# Patient Record
Sex: Male | Born: 1970 | Race: White | Hispanic: No | Marital: Single | Smoking: Former smoker
Health system: Southern US, Community
[De-identification: ages and names within clinical notes are randomized; demographics above are authoritative.]

## PROBLEM LIST (undated history)

## (undated) DIAGNOSIS — K5792 Diverticulitis of intestine, part unspecified, without perforation or abscess without bleeding: Secondary | ICD-10-CM

## (undated) DIAGNOSIS — T7840XA Allergy, unspecified, initial encounter: Secondary | ICD-10-CM

## (undated) HISTORY — PX: SMALL INTESTINE SURGERY: SHX150

## (undated) HISTORY — PX: ABDOMINAL SURGERY: SHX537

## (undated) HISTORY — DX: Allergy, unspecified, initial encounter: T78.40XA

## (undated) HISTORY — DX: Diverticulitis of intestine, part unspecified, without perforation or abscess without bleeding: K57.92

---

## 2008-04-05 ENCOUNTER — Ambulatory Visit: Payer: Self-pay | Admitting: Surgery

## 2008-04-11 ENCOUNTER — Inpatient Hospital Stay: Payer: Self-pay | Admitting: Surgery

## 2008-04-22 ENCOUNTER — Inpatient Hospital Stay: Payer: Self-pay | Admitting: Surgery

## 2008-05-03 ENCOUNTER — Ambulatory Visit: Payer: Self-pay | Admitting: Surgery

## 2008-05-11 ENCOUNTER — Ambulatory Visit: Payer: Self-pay | Admitting: Surgery

## 2008-06-22 IMAGING — CT CT GUIDED ABCESS DRAINAGE WITH CATHETER
2 of 4 series · 14 of 32 positions shown, 19 images · non-contrast
Comparison: none

REASON FOR EXAM: Pelvic abscess
COMMENTS:

[Series 2: soft tissue · axial · 0.79mm/px · z∈[-1258,-1090]mm · 8 of 29 slices shown, 13 images (1 of 2)]
[im 4/29  soft-tissue]
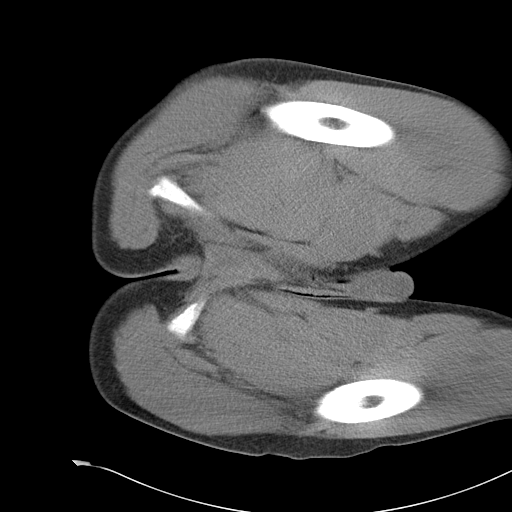
[im 4/29  bone]
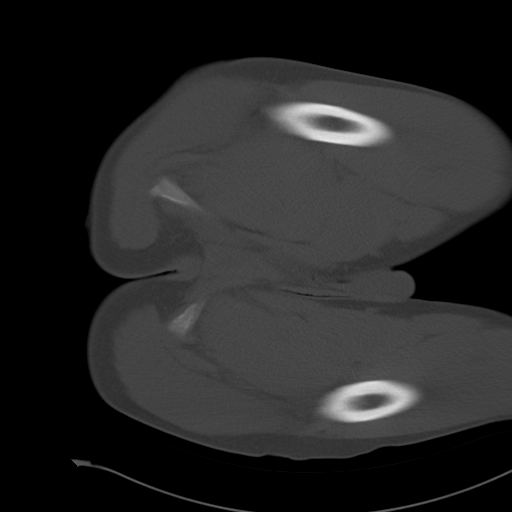
[im 7/29  soft-tissue]
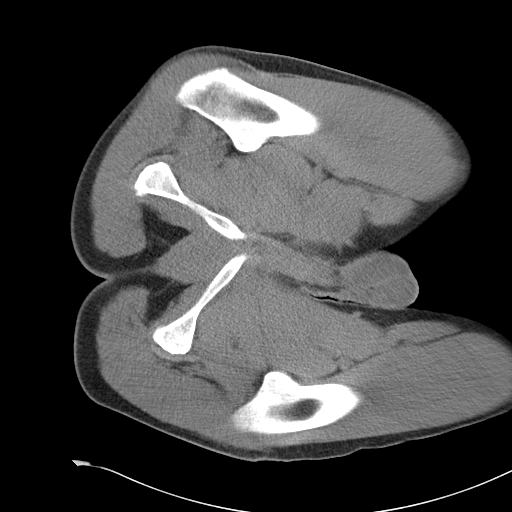
[im 10/29  soft-tissue]
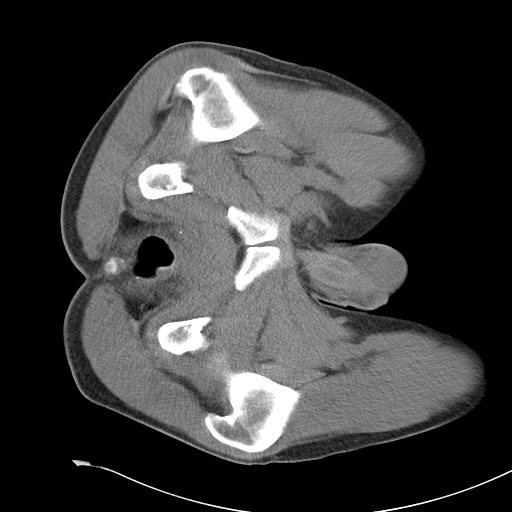
[im 13/29  soft-tissue]
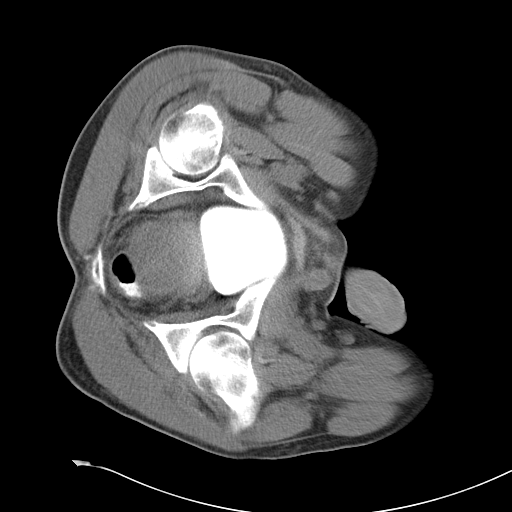
[im 16/29  soft-tissue]
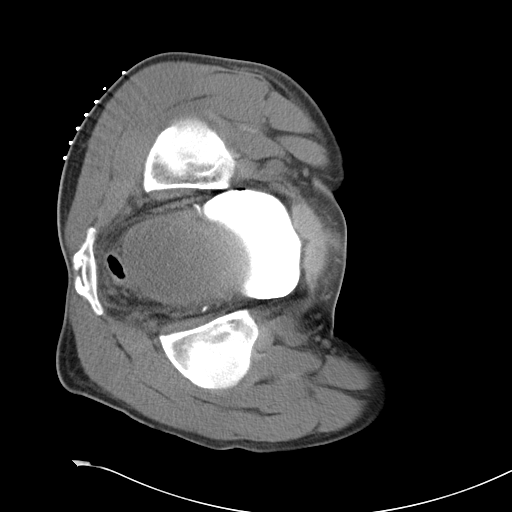
[im 16/29  lung]
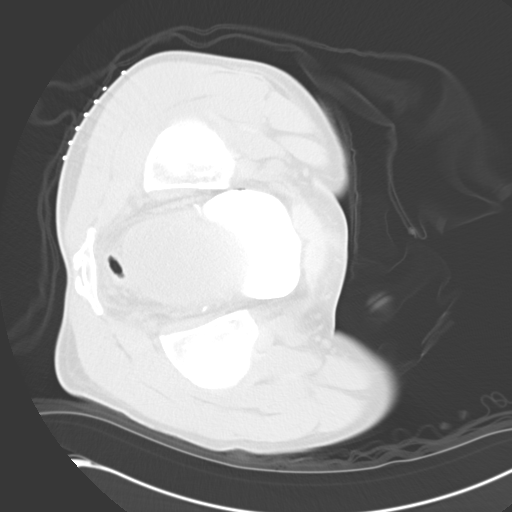
[im 19/29  soft-tissue]
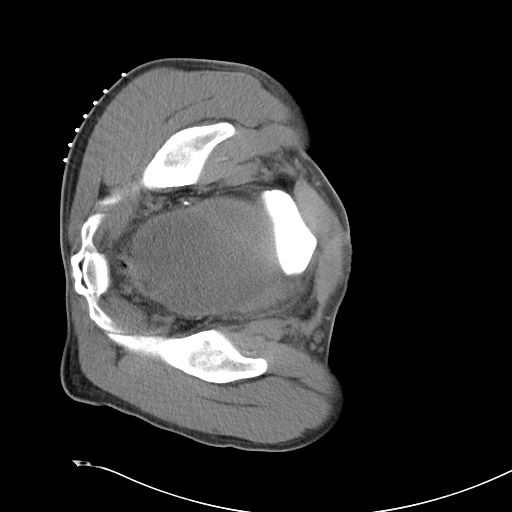
[im 19/29  lung]
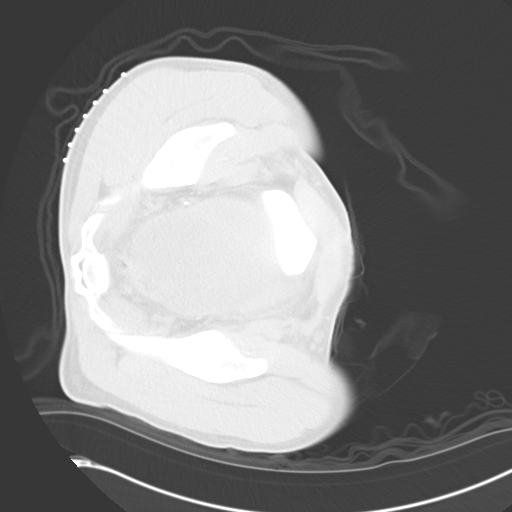
[im 22/29  soft-tissue]
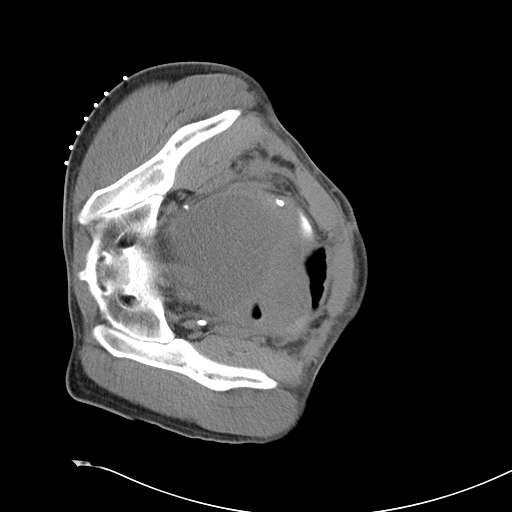
[im 22/29  lung]
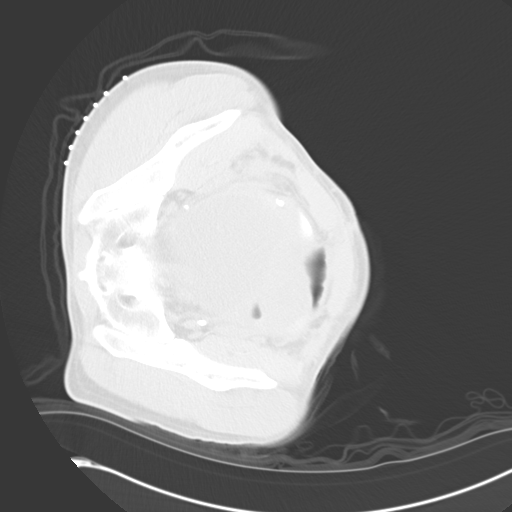
[im 25/29  soft-tissue]
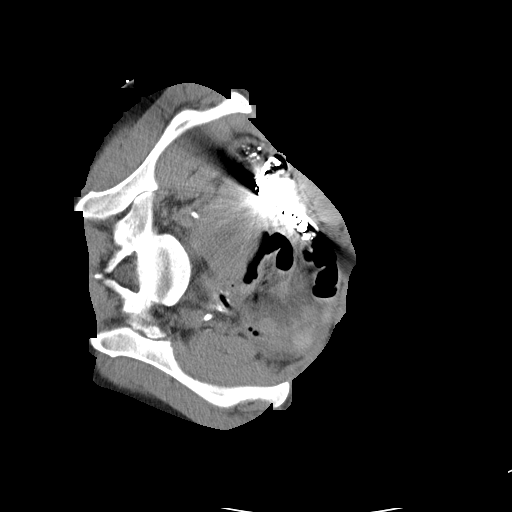
[im 25/29  lung]
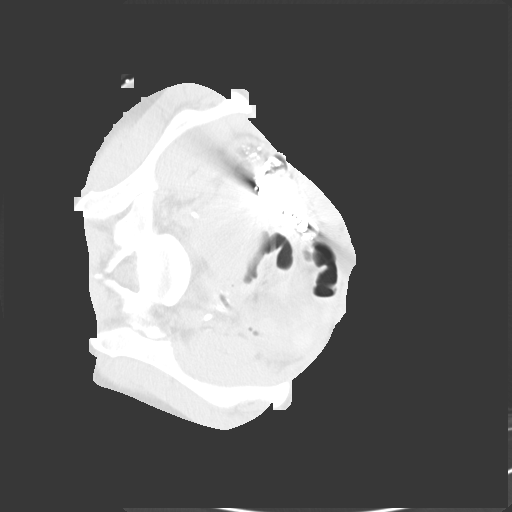

[Series 5: soft tissue · axial · 0.79mm/px · z∈[-1258,-1114]mm · 6 of 29 slices shown (2 of 2)]
[im 4/29  soft-tissue]
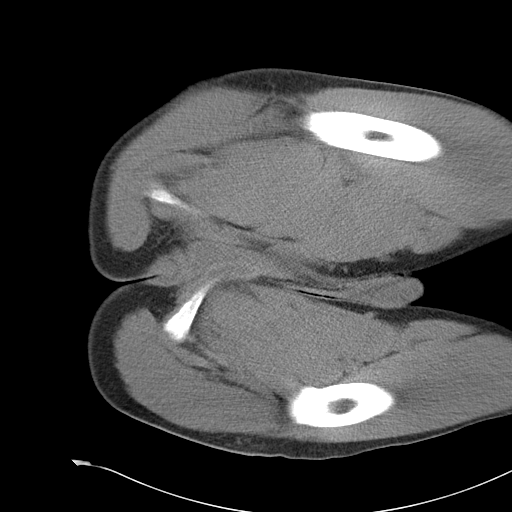
[im 8/29  soft-tissue]
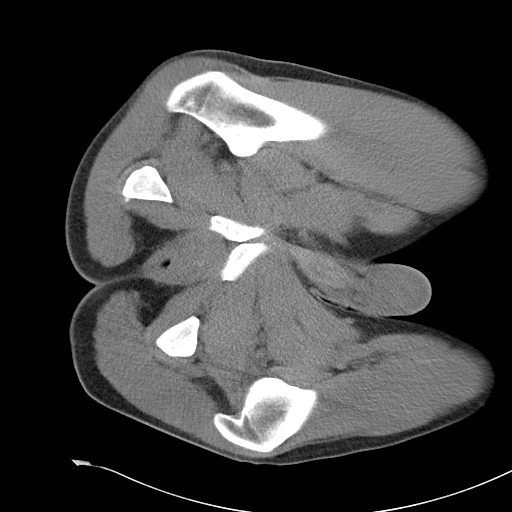
[im 11/29  soft-tissue]
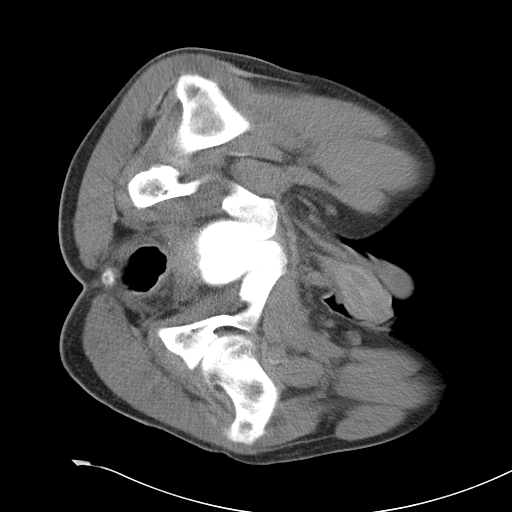
[im 15/29  soft-tissue]
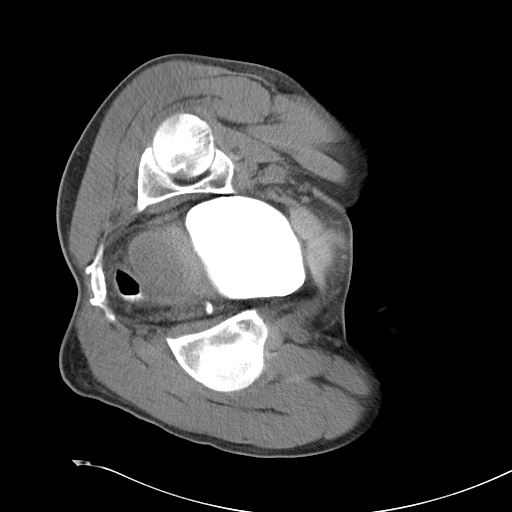
[im 18/29  soft-tissue]
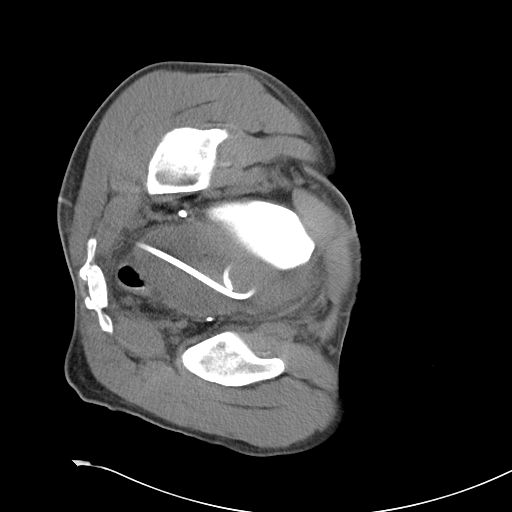
[im 22/29  soft-tissue]
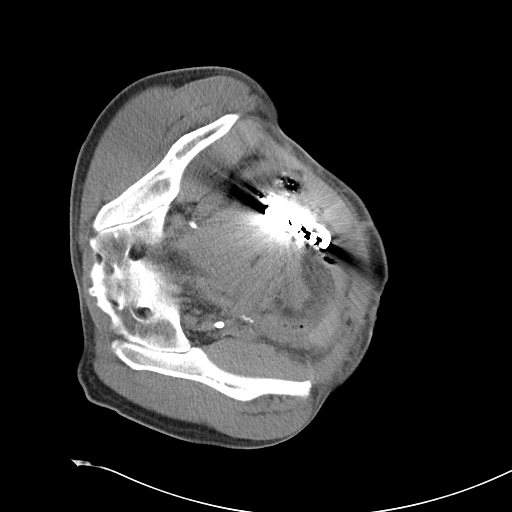

[14 of 32 positions shown; findings below may reference images not displayed]

PROCEDURE:     CT  - CT GUIDED ABSCESS DRAINAGE  - April 25, 2008 [DATE]

RESULT:     The patient has a history of post operative abscess.

PROCEDURE AND FINDINGS:  After discussing the risks and benefits of the
procedure, patient informed consent was obtained. The buttock region was
sterilely prepped and draped following CT localization of the abscess. This
is followed by IV conscious sedation and local anesthesia with 1% Lidocaine.
A 22 gauge needle was placed along the parasacral region into the abscess
and a 0.018 wire. This is followed by placement of Accu-stick guidewire
system and placement of an Amplatz extra-stiff wire and placement of a 12
Maijariitta Larikka catheter. There were no complications. Dark blood that was foul
smelling was retrieved. The system was hooked  to gravity bag drainage. The
catheter was sutured in place and there were no complications.
IMPRESSION: Successful pelvic abscess/infected hematoma drainage.

## 2010-06-26 ENCOUNTER — Ambulatory Visit: Payer: Self-pay | Admitting: Family Medicine

## 2021-03-14 ENCOUNTER — Ambulatory Visit
Admission: EM | Admit: 2021-03-14 | Discharge: 2021-03-14 | Disposition: A | Discharge status: Home / Self Care | Payer: Self-pay | Attending: Physician Assistant | Admitting: Physician Assistant

## 2021-03-14 ENCOUNTER — Encounter: Payer: Self-pay | Admitting: Emergency Medicine

## 2021-03-14 ENCOUNTER — Other Ambulatory Visit: Payer: Self-pay

## 2021-03-14 DIAGNOSIS — R42 Dizziness and giddiness: Secondary | ICD-10-CM

## 2021-03-14 DIAGNOSIS — H9202 Otalgia, left ear: Secondary | ICD-10-CM

## 2021-03-14 DIAGNOSIS — H6123 Impacted cerumen, bilateral: Secondary | ICD-10-CM

## 2021-03-14 DIAGNOSIS — H60502 Unspecified acute noninfective otitis externa, left ear: Secondary | ICD-10-CM

## 2021-03-14 MED ORDER — NEOMYCIN-POLYMYXIN-HC 3.5-10000-1 OT SUSP: 4.0000 [drp] | Freq: Three times a day (TID) | OTIC | 0 refills | Status: AC

## 2021-03-14 MED ORDER — MECLIZINE HCL 25 MG PO TABS: 25.0000 mg | ORAL_TABLET | Freq: Three times a day (TID) | ORAL | 0 refills | Status: AC | PRN

## 2021-03-14 NOTE — ED Provider Notes (Signed)
[facility]    CSN: [id number] Arrival date & time: 03/14/21  1000      History   Chief Complaint Chief Complaint  Patient presents with  . Otalgia    Left     HPI Jeremiah Bush is a 50 y.o. male presenting for approximately 3-week history of intermittent left ear pains which have became worse over the past week.  Denies any drainage from the ear.  He does say that the pain is sharp and stabbing when it comes.  Denies any associated hearing changes or tenderness of the outer ear.  No fever, fatigue, cough, congestion or sore throat.  He denies any injury to the left ear.  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  He says he has not been swimming recently at all.  He states that he did have an ear infection a couple of months ago.  Not taking any OTC meds.  He has no other complaints or concerns.  HPI  History reviewed. No pertinent past medical history.  There are no problems to display for this patient.   Past Surgical History:  Procedure Laterality Date  . ABDOMINAL SURGERY         Home Medications    Prior to Admission medications   Medication Sig Start Date End Date Taking? Authorizing Provider  meclizine (ANTIVERT) 25 MG tablet Take 1 tablet (25 mg total) by mouth 3 (three) times daily as needed for up to 7 days for dizziness. 03/14/21 03/21/21 Yes Shirlee Latch, PA-C  neomycin-polymyxin-hydrocortisone (CORTISPORIN) 3.5-10000-1 OTIC suspension Place 4 drops into the left ear 3 (three) times daily for 7 days. 03/14/21 03/21/21 Yes Shirlee Latch, PA-C    Family History History reviewed. No pertinent family history.  Social History Social History   Tobacco Use  . Smoking status: Never Smoker  . Smokeless tobacco: Never Used  Vaping Use  . Vaping Use: Every day  Substance Use Topics  . Alcohol use: Not Currently  . Drug use: Never     Allergies   Patient has no known allergies.   Review  of Systems Review of Systems  Constitutional: Negative for fatigue and fever.  HENT: Positive for ear pain. Negative for congestion, rhinorrhea, sinus pressure, sinus pain and sore throat.   Respiratory: Negative for cough and shortness of breath.   Gastrointestinal: Negative for abdominal pain, diarrhea, nausea and vomiting.  Musculoskeletal: Negative for myalgias.  Neurological: Positive for dizziness. Negative for weakness, light-headedness and headaches.  Hematological: Negative for adenopathy.     Physical Exam Triage Vital Signs ED Triage Vitals  Enc Vitals Group     BP 03/14/21 1010 108/66     Pulse Rate 03/14/21 1010 64     Resp 03/14/21 1010 16     Temp 03/14/21 1010 98.1 F (36.7 C)     Temp Source 03/14/21 1010 Oral     SpO2 03/14/21 1010 99 %     Weight 03/14/21 1007 181 lb (82.1 kg)     Height 03/14/21 1007 6\' 1"  (1.854 m)     Head Circumference --      Peak Flow --      Pain Score 03/14/21 1007 5     Pain Loc --      Pain Edu? --      Excl. in GC? --    No data found.  Updated Vital Signs BP 108/66 (BP Location: Left Arm)  Pulse 64   Temp 98.1 F (36.7 C) (Oral)   Resp 16   Ht 6\' 1"  (1.854 m)   Wt 181 lb (82.1 kg)   SpO2 99%   BMI 23.88 kg/m       Physical Exam Vitals and nursing note reviewed.  Constitutional:      General: He is not in acute distress.    Appearance: Normal appearance. He is well-developed. He is not ill-appearing.  HENT:     Head: Normocephalic and atraumatic.     Right Ear: Tympanic membrane and external ear normal.     Left Ear: Tympanic membrane, ear canal and external ear normal. There is impacted cerumen.     Ears:     Comments: Moderate cerumen right EAC    Nose: Nose normal.     Mouth/Throat:     Mouth: Mucous membranes are moist.     Pharynx: Oropharynx is clear.  Eyes:     General: No scleral icterus.    Conjunctiva/sclera: Conjunctivae normal.  Cardiovascular:     Rate and Rhythm: Normal rate and regular  rhythm.     Heart sounds: Normal heart sounds.  Pulmonary:     Effort: Pulmonary effort is normal. No respiratory distress.     Breath sounds: Normal breath sounds.  Musculoskeletal:     Cervical back: Neck supple.  Skin:    General: Skin is warm and dry.  Neurological:     General: No focal deficit present.     Mental Status: He is alert. Mental status is at baseline.     Motor: No weakness.     Gait: Gait normal.  Psychiatric:        Mood and Affect: Mood normal.        Behavior: Behavior normal.        Thought Content: Thought content normal.      UC Treatments / Results  Labs (all labs ordered are listed, but only abnormal results are displayed) Labs Reviewed - No data to display  EKG   Radiology No results found.  Procedures Procedures (including critical care time)  Medications Ordered in UC Medications - No data to display  Initial Impression / Assessment and Plan / UC Course  I have reviewed the triage vital signs and the nursing notes.  Pertinent labs & imaging results that were available during my care of the patient were reviewed by me and considered in my medical decision making (see chart for details).   50 year old male presenting for left-sided ear pain for the past 3 weeks, worse over the past week.  Exam reveals cerumen impaction of the left EAC and moderate cerumen seen in the right EAC.  Patient has agreed to have an otic lavage performed today.  Otic lavage performed today.  Successful on the left side and mildly successful on the right side.  Advised him to use OTC Debrox drops on the right side to help with the remaining earwax buildup.  After the otic lavage, left TM is slightly injected, but the EAC is erythematous and swollen.  Whitish debris noted.  This is mild.  No tenderness of the ear.  Suspect he possibly has otitis externa.  Treating him at this time with Cortisporin.  Patient also complained of some mild dizziness especially when he  moves his head.  I did send meclizine for this.  Encouraged him to increase rest and fluids.  Work note provided for today and tomorrow advised him to follow-up with 54 as  needed for any worsening ear pain or if he develops a fever.  Final Clinical Impressions(s) / UC Diagnoses   Final diagnoses:  Otalgia of left ear  Bilateral impacted cerumen  Acute otitis externa of left ear, unspecified type  Dizziness     Discharge Instructions     The wax has been cleaned out of the left ear.  You do still have some remaining earwax in the right ear.  Just use some softening drops (Debrox OTC) in the ear and it should soften and drain.  The left ear canal is red and inflamed.  I have sent an antibiotic drop for that.  I have also sent a medication to use as needed for dizziness.  If you do not have insurance, consider using the good Rx app.  This allows you to get medications for a cheaper rate.  If you have trouble using the app, asked the pharmacist when you pick up your medications.  Please return if you have any worsening ear pain.    ED Prescriptions    Medication Sig Dispense Auth. Provider   neomycin-polymyxin-hydrocortisone (CORTISPORIN) 3.5-10000-1 OTIC suspension Place 4 drops into the left ear 3 (three) times daily for 7 days. 10 mL Eusebio Friendly B, PA-C   meclizine (ANTIVERT) 25 MG tablet Take 1 tablet (25 mg total) by mouth 3 (three) times daily as needed for up to 7 days for dizziness. 20 tablet Gareth Morgan     PDMP not reviewed this encounter.   Shirlee Latch, PA-C 03/14/21 1128

## 2021-03-14 NOTE — Discharge Instructions (Signed)
The wax has been cleaned out of the left ear.  You do still have some remaining earwax in the right ear.  Just use some softening drops (Debrox OTC) in the ear and it should soften and drain.  The left ear canal is red and inflamed.  I have sent an antibiotic drop for that.  I have also sent a medication to use as needed for dizziness.  If you do not have insurance, consider using the good Rx app.  This allows you to get medications for a cheaper rate.  If you have trouble using the app, asked the pharmacist when you pick up your medications.  Please return if you have any worsening ear pain.

## 2021-03-14 NOTE — ED Triage Notes (Signed)
Patient c/o pain in his left ear that started 3 weeks ago. Patient denies fevers.

## 2021-03-17 ENCOUNTER — Other Ambulatory Visit: Payer: Self-pay

## 2021-03-17 ENCOUNTER — Ambulatory Visit
Admission: EM | Admit: 2021-03-17 | Discharge: 2021-03-17 | Disposition: A | Discharge status: Home / Self Care | Payer: Self-pay | Attending: Family Medicine | Admitting: Family Medicine

## 2021-03-17 DIAGNOSIS — H6121 Impacted cerumen, right ear: Secondary | ICD-10-CM

## 2021-03-17 NOTE — ED Provider Notes (Signed)
[facility]    CSN: [id number] Arrival date & time: 03/17/21  3546      History   Chief Complaint Chief Complaint  Patient presents with  . Ear Pain   HPI  50 year old male presents with the above complaint.  Patient recently seen on 4/29.  Had left cerumen impaction and also had a lot of wax in the right ear.  Successful lavage of the left ear.  Staff unable to remove cerumen from right ear.  He has been using prescribed drops but this has not helped.  He reports some difficulty with gait due to feeling dizzy.  He states that his pain is 8/10 in severity.  Sharp.  No relieving factors.  No respiratory symptoms.  No other complaints.   Home Medications    Prior to Admission medications   Medication Sig Start Date End Date Taking? Authorizing Provider  meclizine (ANTIVERT) 25 MG tablet Take 1 tablet (25 mg total) by mouth 3 (three) times daily as needed for up to 7 days for dizziness. 03/14/21 03/21/21 Yes Shirlee Latch, PA-C  neomycin-polymyxin-hydrocortisone (CORTISPORIN) 3.5-10000-1 OTIC suspension Place 4 drops into the left ear 3 (three) times daily for 7 days. 03/14/21 03/21/21 Yes Shirlee Latch, PA-C   Social History Social History   Tobacco Use  . Smoking status: Never Smoker  . Smokeless tobacco: Never Used  Vaping Use  . Vaping Use: Every day  Substance Use Topics  . Alcohol use: Not Currently  . Drug use: Never     Allergies   Patient has no known allergies.   Review of Systems Review of Systems Per HPI  Physical Exam Triage Vital Signs ED Triage Vitals  Enc Vitals Group     BP 03/17/21 0824 131/78     Pulse Rate 03/17/21 0824 63     Resp 03/17/21 0824 16     Temp 03/17/21 0824 98.3 F (36.8 C)     Temp Source 03/17/21 0824 Oral     SpO2 03/17/21 0824 99 %     Weight 03/17/21 0823 181 lb (82.1 kg)     Height 03/17/21 0823 6\' 1"  (1.854 m)     Head Circumference --      Peak Flow --      Pain Score 03/17/21 0823 8     Pain Loc --       Pain Edu? --      Excl. in GC? --    Updated Vital Signs BP 131/78 (BP Location: Right Arm)   Pulse 63   Temp 98.3 F (36.8 C) (Oral)   Resp 16   Ht 6\' 1"  (1.854 m)   Wt 82.1 kg   SpO2 99%   BMI 23.88 kg/m   Visual Acuity Right Eye Distance:   Left Eye Distance:   Bilateral Distance:    Right Eye Near:   Left Eye Near:    Bilateral Near:     Physical Exam Vitals and nursing note reviewed.  Constitutional:      General: He is not in acute distress.    Appearance: Normal appearance.  HENT:     Head: Normocephalic and atraumatic.     Right Ear: There is impacted cerumen.     Left Ear: Tympanic membrane and external ear normal.     Ears:     Comments:   Pulmonary:     Effort: Pulmonary effort is normal. No respiratory distress.  Neurological:     Mental Status: He is  alert.  Psychiatric:        Mood and Affect: Mood normal.        Behavior: Behavior normal.    UC Treatments / Results  Labs (all labs ordered are listed, but only abnormal results are displayed) Labs Reviewed - No data to display  EKG   Radiology No results found.  Procedures Procedures (including critical care time)  Medications Ordered in UC Medications - No data to display  Initial Impression / Assessment and Plan / UC Course  I have reviewed the triage vital signs and the nursing notes.  Pertinent labs & imaging results that were available during my care of the patient were reviewed by me and considered in my medical decision making (see chart for details).    50 year old male presents with persistent cerumen impaction.  Unable to remove today.  [facility] and patient has an appointment at 3:00.  Final Clinical Impressions(s) / UC Diagnoses   Final diagnoses:  Impacted cerumen of right ear   Discharge Instructions   None    ED Prescriptions    None     PDMP not reviewed this encounter.   Everlene Other [place], [place] 03/17/21 [phone]

## 2021-03-17 NOTE — ED Triage Notes (Signed)
Pt was here Friday for ear pain, ears were cleaned out. Pt states he cannot hear out of his right ear and is c/o dizziness. States he feels like he is dealing with vertigo.

## 2021-06-06 ENCOUNTER — Ambulatory Visit
Admission: EM | Admit: 2021-06-06 | Discharge: 2021-06-06 | Disposition: A | Discharge status: Home / Self Care | Payer: Self-pay | Attending: Physician Assistant | Admitting: Physician Assistant

## 2021-06-06 ENCOUNTER — Ambulatory Visit (INDEPENDENT_AMBULATORY_CARE_PROVIDER_SITE_OTHER): Payer: Self-pay

## 2021-06-06 ENCOUNTER — Encounter: Payer: Self-pay | Admitting: Emergency Medicine

## 2021-06-06 ENCOUNTER — Other Ambulatory Visit: Payer: Self-pay

## 2021-06-06 DIAGNOSIS — S39012A Strain of muscle, fascia and tendon of lower back, initial encounter: Secondary | ICD-10-CM

## 2021-06-06 DIAGNOSIS — W11XXXA Fall on and from ladder, initial encounter: Secondary | ICD-10-CM

## 2021-06-06 DIAGNOSIS — M549 Dorsalgia, unspecified: Secondary | ICD-10-CM

## 2021-06-06 DIAGNOSIS — S29019A Strain of muscle and tendon of unspecified wall of thorax, initial encounter: Secondary | ICD-10-CM

## 2021-06-06 DIAGNOSIS — W19XXXA Unspecified fall, initial encounter: Secondary | ICD-10-CM

## 2021-06-06 MED ORDER — PREDNISONE 10 MG PO TABS: ORAL_TABLET | ORAL | 0 refills | Status: DC

## 2021-06-06 MED ORDER — CYCLOBENZAPRINE HCL 10 MG PO TABS: 10.0000 mg | ORAL_TABLET | Freq: Three times a day (TID) | ORAL | 0 refills | Status: AC | PRN

## 2021-06-06 NOTE — Discharge Instructions (Addendum)
X-rays are normal. You have strained muscles and muscle spasms.  BACK PAIN: Stressed avoiding painful activities . RICE (REST, ICE, COMPRESSION, ELEVATION) guidelines reviewed. May alternate ice and heat. Consider use of muscle rubs, Salonpas patches, etc. Use medications as directed including muscle relaxers if prescribed. Take anti-inflammatory medications as prescribed or OTC NSAIDs/Tylenol.  F/u with PCP in 7-10 days for reexamination, and please feel free to call or return to the urgent care at any time for any questions or concerns you may have and we will be happy to help you!   BACK PAIN RED FLAGS: If the back pain acutely worsens or there are any red flag symptoms such as numbness/tingling, leg weakness, saddle anesthesia, or loss of bowel/bladder control, go immediately to the ER. Follow up with [place] as scheduled or sooner if the pain does not begin to resolve or if it worsens before the follow up

## 2021-06-06 NOTE — ED Triage Notes (Signed)
Pt c/o mid back pain. Started about 10 days ago. He states he fell about 4 foot off a 6 foot ladder. He states it feels tight. Walking makes the pain better but reaching and twisting makes it worse.

## 2021-06-06 NOTE — ED Provider Notes (Signed)
[facility]    CSN: [id number] Arrival date & time: 06/06/21  3428      History   Chief Complaint Chief Complaint  Patient presents with   Back Pain    HPI Jeremiah Bush is a 50 y.o. male presenting for constant aching mid back pain and lower back pain for the past 9 to 10 days.  Also admits to left scapular pain. Patient states that he fell about 4 feet from a ladder and landed on his left shoulder and back.  He says that he started to have pain pretty soon after he fell but pain seem to be improving over the last couple of days until today when he felt a "sharp pain from the right upper back radiate all the way down to the lower back and down the left leg."  He also admits to a "numbing feeling" in the center of his back.  Pain is worse with lying flat on his back, position changes, forward flexion, and rotation to the left. He denies any leg weakness, bowel or bladder incontinence or saddle anesthesia.  No urinary symptoms.  Patient denies any rib or chest pain.  No cough or pain on breathing.  He has not been taking any OTC meds because he says he does not like to take medication.  Patient denies any history of back problems.  No other complaints or concerns.  HPI  History reviewed. No pertinent past medical history.  There are no problems to display for this patient.   Past Surgical History:  Procedure Laterality Date   ABDOMINAL SURGERY         Home Medications    Prior to Admission medications   Medication Sig Start Date End Date Taking? Authorizing Provider  cyclobenzaprine (FLEXERIL) 10 MG tablet Take 1 tablet (10 mg total) by mouth 3 (three) times daily as needed for up to 10 days for muscle spasms. 06/06/21 06/16/21 Yes Shirlee Latch, PA-C  predniSONE (DELTASONE) 10 MG tablet Take 6 tabs PO on day 1 and decrease by 1 tab daily until complete 06/06/21  Yes Shirlee Latch, PA-C    Family History No family history on file.  Social History Social  History   Tobacco Use   Smoking status: Some Days    Types: Cigarettes   Smokeless tobacco: Never  Vaping Use   Vaping Use: Former  Substance Use Topics   Alcohol use: Not Currently   Drug use: Never     Allergies   Patient has no known allergies.   Review of Systems Review of Systems  Respiratory:  Negative for shortness of breath.   Cardiovascular:  Negative for chest pain.  Gastrointestinal:  Negative for abdominal pain.  Genitourinary:  Negative for dysuria, flank pain and hematuria.  Musculoskeletal:  Positive for back pain. Negative for gait problem.  Skin:  Negative for wound.  Neurological:  Positive for numbness. Negative for weakness.    Physical Exam Triage Vital Signs ED Triage Vitals  Enc Vitals Group     BP 06/06/21 0832 140/90     Pulse Rate 06/06/21 0832 80     Resp 06/06/21 0832 18     Temp 06/06/21 0832 98.5 F (36.9 C)     Temp Source 06/06/21 0832 Oral     SpO2 06/06/21 0832 100 %     Weight 06/06/21 0830 181 lb (82.1 kg)     Height 06/06/21 0830 6\' 1"  (1.854 m)     Head Circumference --  Peak Flow --      Pain Score 06/06/21 0830 6     Pain Loc --      Pain Edu? --      Excl. in GC? --    No data found.  Updated Vital Signs BP 140/90 (BP Location: Right Arm)   Pulse 80   Temp 98.5 F (36.9 C) (Oral)   Resp 18   Ht 6\' 1"  (1.854 m)   Wt 181 lb (82.1 kg)   SpO2 100%   BMI 23.88 kg/m    Physical Exam Vitals and nursing note reviewed.  Constitutional:      General: He is not in acute distress.    Appearance: Normal appearance. He is well-developed. He is not ill-appearing.  HENT:     Head: Normocephalic and atraumatic.  Cardiovascular:     Rate and Rhythm: Normal rate and regular rhythm.     Heart sounds: Normal heart sounds.  Pulmonary:     Effort: Pulmonary effort is normal. No respiratory distress.     Breath sounds: Normal breath sounds.  Chest:     Chest wall: No tenderness.  Musculoskeletal:     Right shoulder:  Tenderness (TTP posterior scapula) present. Normal range of motion.     Cervical back: Normal and neck supple.     Thoracic back: Tenderness and bony tenderness (TTP diffusely of spinous processes (T8-T12) of t-spine and left paravertebral muscles) present.     Lumbar back: Bony tenderness (TTP L1-L5 spinous processes and left paravertebral muscles) present. Decreased range of motion. Positive left straight leg raise test (increased pain in back and posterior left leg). Negative right straight leg raise test.  Skin:    General: Skin is warm and dry.  Neurological:     General: No focal deficit present.     Mental Status: He is alert. Mental status is at baseline.     Motor: No weakness.     Gait: Gait normal.  Psychiatric:        Mood and Affect: Mood normal.        Thought Content: Thought content normal.     UC Treatments / Results  Labs (all labs ordered are listed, but only abnormal results are displayed) Labs Reviewed - No data to display  EKG   Radiology DG Thoracic Spine 2 View  Result Date: 06/06/2021 CLINICAL DATA:  Patient fell 4 feet from a ladder about 1 week ago. Persistent back pain. EXAM: THORACIC SPINE 2 VIEWS COMPARISON:  None. FINDINGS: There is no evidence of thoracic spine fracture. Alignment is normal. No other significant bone abnormalities are identified. IMPRESSION: Negative. Electronically Signed   By: 06/08/2021 M.D.   On: 06/06/2021 09:48   DG Lumbar Spine Complete  Result Date: 06/06/2021 CLINICAL DATA:  Patient fell 4 feet off a ladder 1 week ago. Persistent pain. EXAM: LUMBAR SPINE - COMPLETE 4+ VIEW COMPARISON:  None. FINDINGS: There is no evidence of lumbar spine fracture. Alignment is normal. Intervertebral disc spaces are maintained. IMPRESSION: Negative. Electronically Signed   By: 06/08/2021 M.D.   On: 06/06/2021 09:49    Procedures Procedures (including critical care time)  Medications Ordered in UC Medications - No data to  display  Initial Impression / Assessment and Plan / UC Course  I have reviewed the triage vital signs and the nursing notes.  Pertinent labs & imaging results that were available during my care of the patient were reviewed by me and considered in my medical  decision making (see chart for details).  50 year old male presenting for mid back pain following a fall onto his back 10 days ago.  He does have spinal tenderness of the thoracic and lumbar region as well as decreased range of motion of the L-spine and positive straight leg raise on the left.  X-rays obtained due to spinal tenderness.  Assessing for possible underlying fracture.  X-rays reviewed by me and no evidence of fracture.  Overread confirms.  Discussed results with patient.  Advised him not he has a strain of left thoracic and lumbar region.  Also suspect he could possibly have disc bulge causing the occasional left-sided leg pain.  Treating him at this time with prednisone and cyclobenzaprine.  Also encourage stretches and use of heat.  ED precautions reviewed with patient.  Work note given.  Final Clinical Impressions(s) / UC Diagnoses   Final diagnoses:  Strain of lumbar region, initial encounter  Thoracic myofascial strain, initial encounter     Discharge Instructions      X-rays are normal. You have strained muscles and muscle spasms.  BACK PAIN: Stressed avoiding painful activities . RICE (REST, ICE, COMPRESSION, ELEVATION) guidelines reviewed. May alternate ice and heat. Consider use of muscle rubs, Salonpas patches, etc. Use medications as directed including muscle relaxers if prescribed. Take anti-inflammatory medications as prescribed or OTC NSAIDs/Tylenol.  F/u with PCP in 7-10 days for reexamination, and please feel free to call or return to the urgent care at any time for any questions or concerns you may have and we will be happy to help you!   BACK PAIN RED FLAGS: If the back pain acutely worsens or there are any  red flag symptoms such as numbness/tingling, leg weakness, saddle anesthesia, or loss of bowel/bladder control, go immediately to the ER. Follow up with [place] as scheduled or sooner if the pain does not begin to resolve or if it worsens before the follow up       ED Prescriptions     Medication Sig Dispense Auth. Provider   cyclobenzaprine (FLEXERIL) 10 MG tablet Take 1 tablet (10 mg total) by mouth 3 (three) times daily as needed for up to 10 days for muscle spasms. 30 tablet Eusebio Friendly B, PA-C   predniSONE (DELTASONE) 10 MG tablet Take 6 tabs PO on day 1 and decrease by 1 tab daily until complete 21 tablet Shirlee Latch, PA-C      PDMP not reviewed this encounter.   Shirlee Latch, PA-C 06/06/21 1009

## 2021-07-11 ENCOUNTER — Ambulatory Visit
Admission: EM | Admit: 2021-07-11 | Discharge: 2021-07-11 | Disposition: A | Discharge status: Home / Self Care | Payer: Self-pay | Attending: Emergency Medicine | Admitting: Emergency Medicine

## 2021-07-11 ENCOUNTER — Encounter: Payer: Self-pay | Admitting: Emergency Medicine

## 2021-07-11 ENCOUNTER — Other Ambulatory Visit: Payer: Self-pay

## 2021-07-11 DIAGNOSIS — L245 Irritant contact dermatitis due to other chemical products: Secondary | ICD-10-CM

## 2021-07-11 MED ORDER — PREDNISONE 10 MG (21) PO TBPK: ORAL_TABLET | ORAL | 0 refills | Status: DC

## 2021-07-11 NOTE — Discharge Instructions (Addendum)
Take the Prednisone daily at breakfast for 6 days.  Use OTC Claritin or Zyrtec during the day for itching and Benadryl at bedtime.  Avoid exposure to paints or solvents as these may be what is causing the dermatitis.  Wear rubber gloves when painting or handling solvents.  Return for re-evaluation of new or worsening symptoms.

## 2021-07-11 NOTE — ED Provider Notes (Signed)
[facility]    CSN: [id number] Arrival date & time: 07/11/21  0843      History   Chief Complaint Chief Complaint  Patient presents with   Rash    HPI Jeremiah Bush is a 50 y.o. male.   HPI  50 year old male here for evaluation of redness and burning to his hands.  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  In the last 10 days he has developed redness to the backs of both hands that itch and burn.  The redness improves over the weekend when he is not at work and right nights when he goes back to work.  He also endorses that the redness and itching increase when he is sweating.  He denies any contact with poison ivy, poison oak, sumac, or [place].  He states that the rash is not spreading it is just on the back of his hands and his wrists.  There is nothing on his palmar surfaces or forearms.  History reviewed. No pertinent past medical history.  There are no problems to display for this patient.   Past Surgical History:  Procedure Laterality Date   ABDOMINAL SURGERY         Home Medications    Prior to Admission medications   Medication Sig Start Date End Date Taking? Authorizing Provider  predniSONE (STERAPRED UNI-PAK 21 TAB) 10 MG (21) TBPK tablet Take 6 tablets on day 1, 5 tablets day 2, 4 tablets day 3, 3 tablets day 4, 2 tablets day 5, 1 tablet day 6 07/11/21  Yes Becky Augusta, NP    Family History History reviewed. No pertinent family history.  Social History Social History   Tobacco Use   Smoking status: Former    Types: Cigarettes   Smokeless tobacco: Never  Vaping Use   Vaping Use: Every day  Substance Use Topics   Alcohol use: Not Currently   Drug use: Never     Allergies   Patient has no known allergies.   Review of Systems Review of Systems  Constitutional:  Negative for activity change,  appetite change and fever.  Skin:  Positive for color change and rash.  Hematological: Negative.   Psychiatric/Behavioral: Negative.      Physical Exam Triage Vital Signs ED Triage Vitals  Enc Vitals Group     BP 07/11/21 0856 135/88     Pulse --      Resp 07/11/21 0856 16     Temp 07/11/21 0856 98.6 F (37 C)     Temp Source 07/11/21 0856 Oral     SpO2 07/11/21 0856 98 %     Weight 07/11/21 0854 180 lb (81.6 kg)     Height 07/11/21 0854 6\' 1"  (1.854 m)     Head Circumference --      Peak Flow --      Pain Score 07/11/21 0854 4     Pain Loc --      Pain Edu? --      Excl. in GC? --    No data found.  Updated Vital Signs BP 135/88 (BP Location: Left Arm)   Temp 98.6 F (37 C) (Oral)   Resp 16   Ht 6\' 1"  (1.854 m)   Wt 180 lb (81.6 kg)   SpO2 98%   BMI 23.75 kg/m   Visual Acuity Right Eye  Distance:   Left Eye Distance:   Bilateral Distance:    Right Eye Near:   Left Eye Near:    Bilateral Near:     Physical Exam Vitals and nursing note reviewed.  Constitutional:      General: He is not in acute distress.    Appearance: Normal appearance. He is not ill-appearing.  HENT:     Head: Normocephalic and atraumatic.  Skin:    General: Skin is warm and dry.     Capillary Refill: Capillary refill takes less than 2 seconds.     Findings: Erythema and rash present.  Neurological:     General: No focal deficit present.     Mental Status: He is alert and oriented to person, place, and time.     Sensory: No sensory deficit.     Motor: No weakness.  Psychiatric:        Mood and Affect: Mood normal.        Behavior: Behavior normal.        Thought Content: Thought content normal.        Judgment: Judgment normal.     UC Treatments / Results  Labs (all labs ordered are listed, but only abnormal results are displayed) Labs Reviewed - No data to display  EKG   Radiology No results found.  Procedures Procedures (including critical care time)  Medications  Ordered in UC Medications - No data to display  Initial Impression / Assessment and Plan / UC Course  I have reviewed the triage vital signs and the nursing notes.  Pertinent labs & imaging results that were available during my care of the patient were reviewed by me and considered in my medical decision making (see chart for details).  Nontoxic-appearing 50 year old male here for evaluation of redness and burning to the backs of both hands and the proximal dorsal aspect of both wrists that been present for last 10 days.  He reports that they itch and burn, this is worse when he sweats, and he thinks it is related to the acrylic paint and solvents that he is recently started using at a new job.  He reports he is never been exposed these chemicals in the past.  He does not think that is poison oak or poison sumac, or some other corrosive plant, as it is not spreading.  He is also used calamine lotion and reports that there is been no relief of symptoms.  Physical exam reveals erythematous patches on the dorsal aspects of both hands and the proximal wrist.  There is no rash on the volar aspect of either hand or forearm.  There are no vesicular lesions.  There are some scabbed areas where the patient has been scratching.  The areas are not hot, edematous, indurated, or fluctuant.  Do not suspect cellulitis but rather contact dermatitis most likely secondary to chemical exposure.  I have advised the patient that he should wear long rubber gloves when he is mixing paints or utilizing solvents so as to avoid skin exposure.  We will treat for contact dermatitis with a prednisone pack and over-the-counter antihistamines.  Patient advised to return for new or worsening symptoms.  Work note provided.   Final Clinical Impressions(s) / UC Diagnoses   Final diagnoses:  Irritant contact dermatitis due to other chemical products     Discharge Instructions      Take the Prednisone daily at breakfast for 6  days.  Use OTC Claritin or Zyrtec during the day for  itching and Benadryl at bedtime.  Avoid exposure to paints or solvents as these may be what is causing the dermatitis.  Wear rubber gloves when painting or handling solvents.  Return for re-evaluation of new or worsening symptoms.      ED Prescriptions     Medication Sig Dispense Auth. Provider   predniSONE (STERAPRED UNI-PAK 21 TAB) 10 MG (21) TBPK tablet Take 6 tablets on day 1, 5 tablets day 2, 4 tablets day 3, 3 tablets day 4, 2 tablets day 5, 1 tablet day 6 21 tablet Becky Augusta, NP      PDMP not reviewed this encounter.   Becky Augusta, NP 07/11/21 [phone]

## 2021-07-11 NOTE — ED Triage Notes (Signed)
Patient c/o redness and burning sensation in both his hands that started 10 days ago.

## 2021-08-03 IMAGING — CR DG THORACIC SPINE 2V
3 series · 3 of 3 positions shown · non-contrast
Comparison: None.

CLINICAL DATA: Patient fell 4 feet from a ladder about 1 week ago.
Persistent back pain.

EXAM:
THORACIC SPINE 2 VIEWS

[t-spine ap]
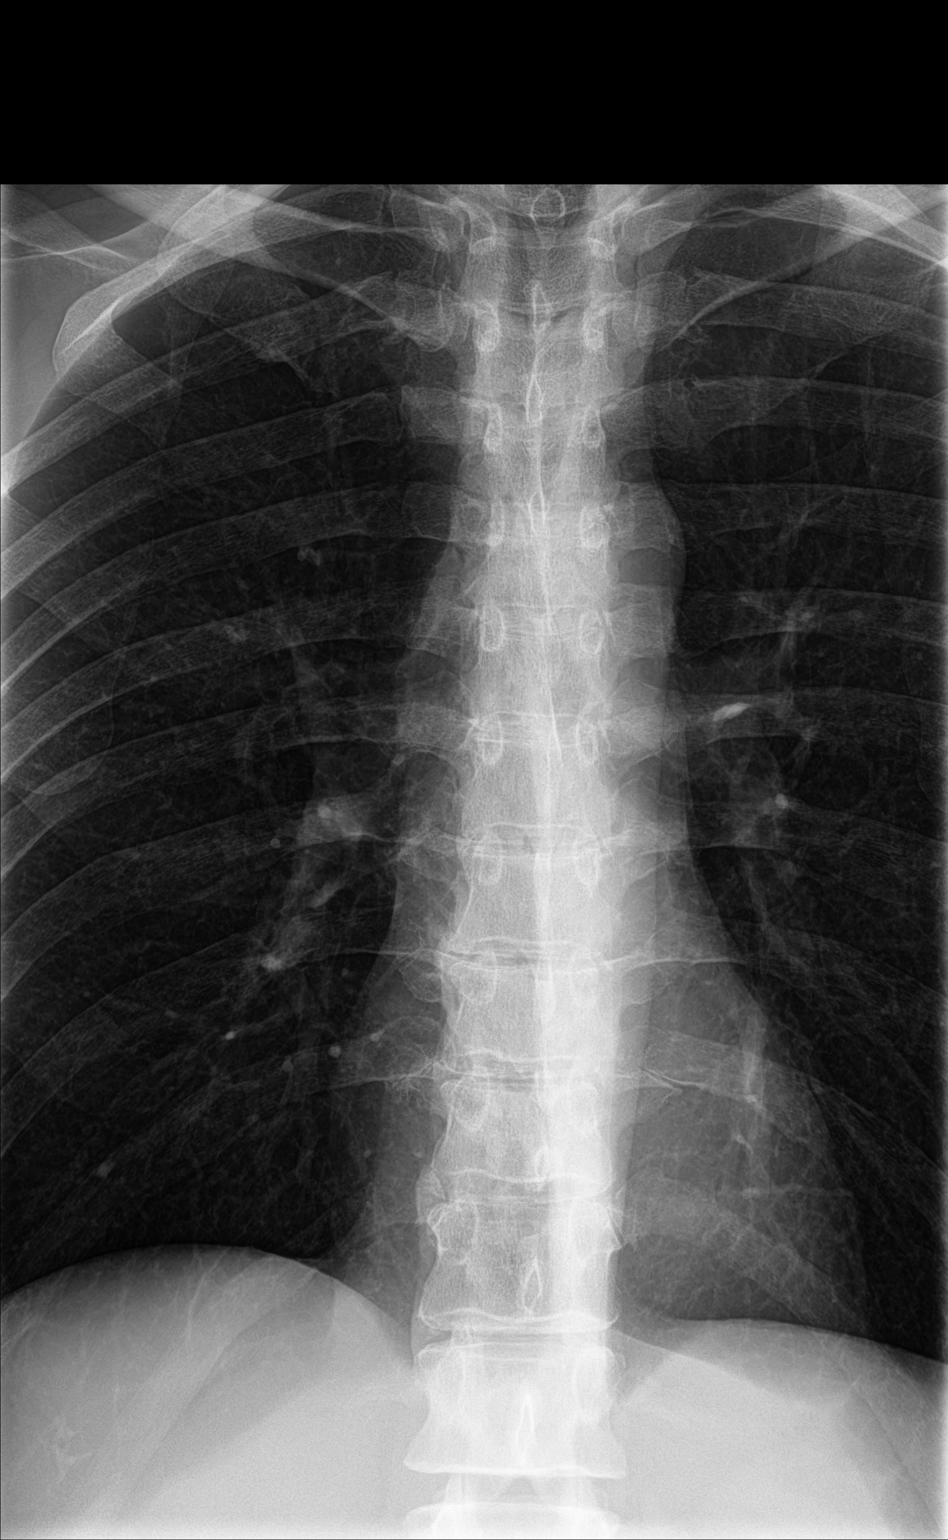

[t-spine lat]
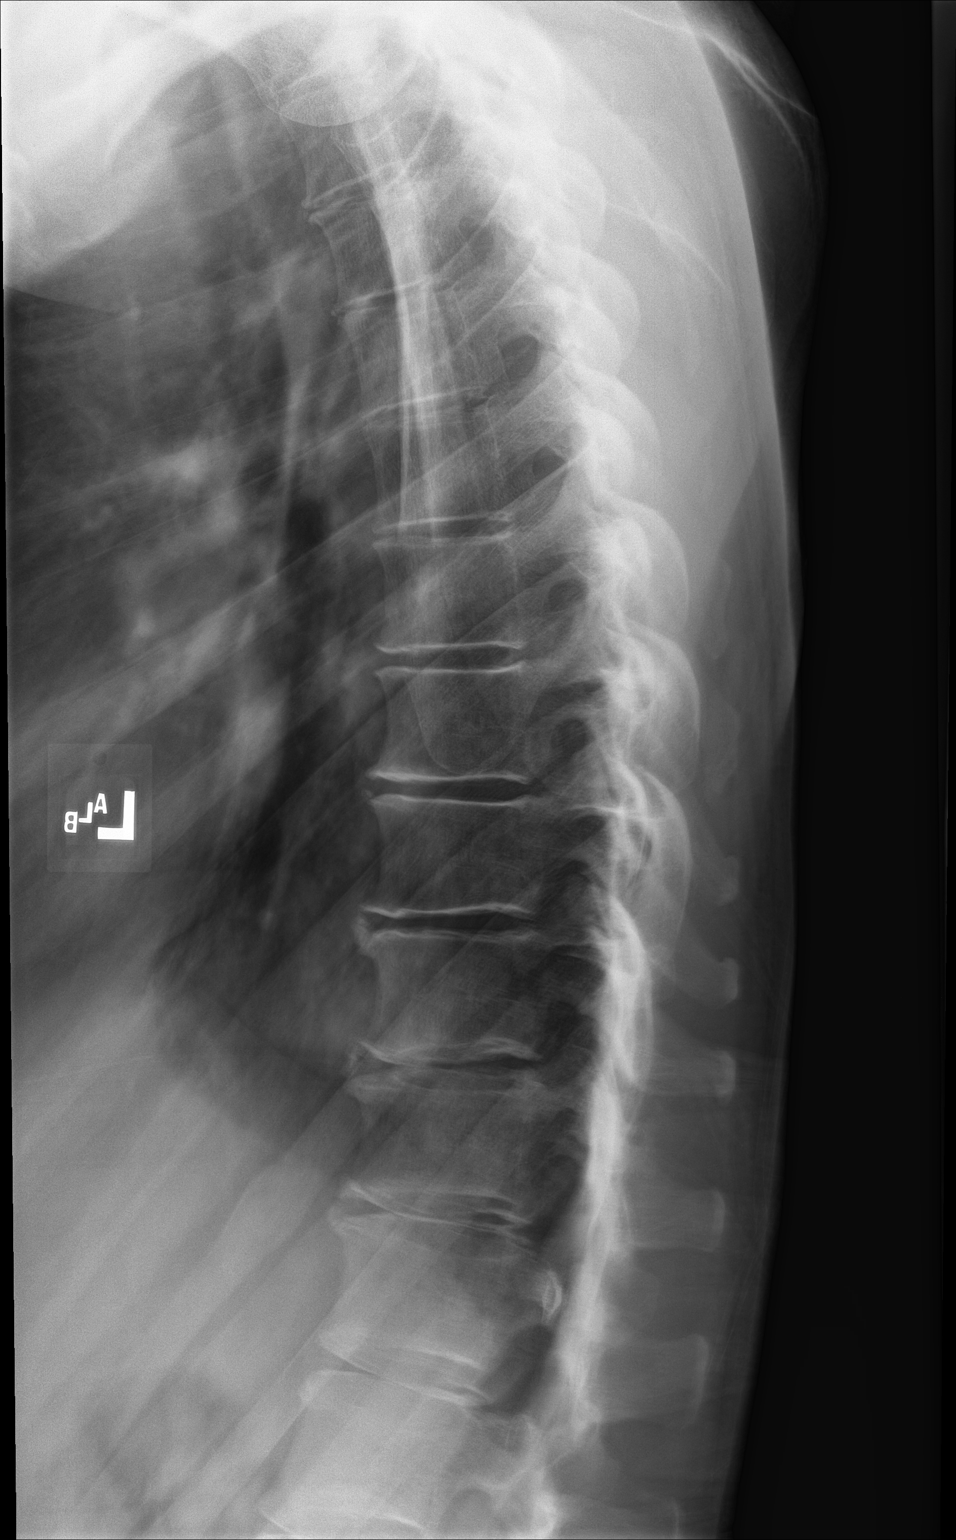

[t-spine swimmers]
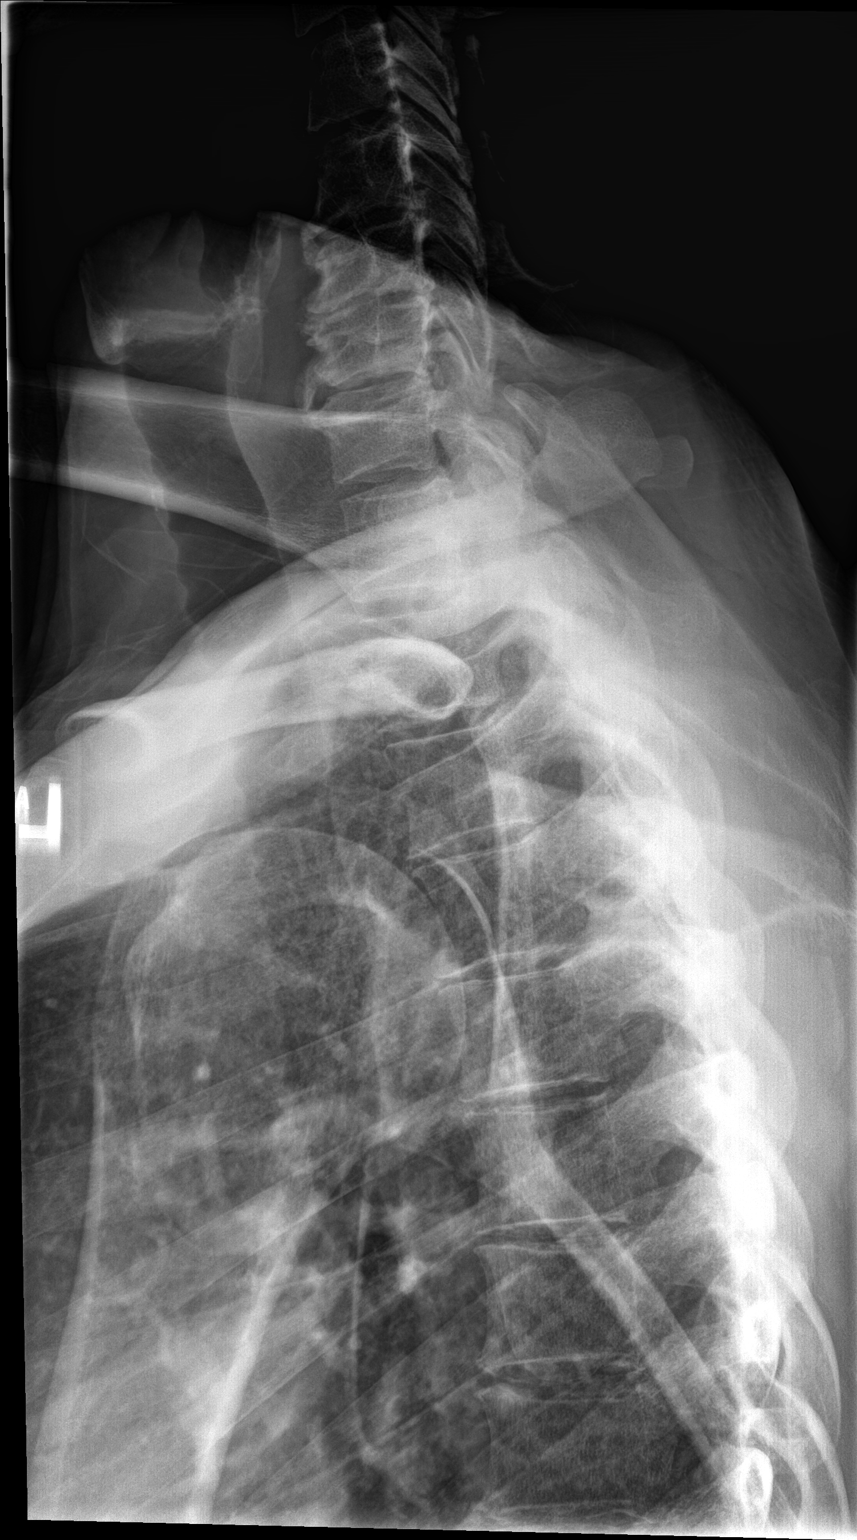

[3 of 3 positions shown; findings below may reference images not displayed]

FINDINGS: There is no evidence of thoracic spine fracture. Alignment is
normal. No other significant bone abnormalities are identified.
IMPRESSION: Negative.

## 2022-10-29 ENCOUNTER — Encounter: Payer: Self-pay | Admitting: Emergency Medicine

## 2022-10-29 ENCOUNTER — Ambulatory Visit
Admission: EM | Admit: 2022-10-29 | Discharge: 2022-10-29 | Disposition: A | Discharge status: Home / Self Care | Payer: Self-pay | Attending: Emergency Medicine | Admitting: Emergency Medicine

## 2022-10-29 DIAGNOSIS — J069 Acute upper respiratory infection, unspecified: Secondary | ICD-10-CM | POA: Insufficient documentation

## 2022-10-29 LAB — GROUP A STREP BY PCR: Group A Strep by PCR: NOT DETECTED

## 2022-10-29 MED ORDER — AMOXICILLIN 500 MG PO CAPS: 500.0000 mg | ORAL_CAPSULE | Freq: Two times a day (BID) | ORAL | 0 refills | Status: AC

## 2022-10-29 MED ORDER — PROMETHAZINE-DM 6.25-15 MG/5ML PO SYRP: 5.0000 mL | ORAL_SOLUTION | Freq: Four times a day (QID) | ORAL | 0 refills | Status: DC | PRN

## 2022-10-29 MED ORDER — BENZONATATE 100 MG PO CAPS: 100.0000 mg | ORAL_CAPSULE | Freq: Three times a day (TID) | ORAL | 0 refills | Status: DC

## 2022-10-29 MED ORDER — PREDNISONE 20 MG PO TABS: 40.0000 mg | ORAL_TABLET | Freq: Every day | ORAL | 0 refills | Status: DC

## 2022-10-29 NOTE — Discharge Instructions (Signed)
Your symptoms today are most likely being caused by a virus and should steadily improve in time it can take up to 7 to 10 days before you truly start to see a turnaround however things will get better  Strep test is pending, if you are positive you will be notified via telephone and amoxicillin sent to the pharmacy at time of notification  If testing today is negative then on day 10 of your illness if you have seen no improvement with the use of supportive medicine amoxicillin will be at the pharmacy for you to use on Tuesday, November 03, 2022.  Begin use of Tessalon pill every 8 hours to help calm your coughing  Begin use of Promethazine DM every 6 hours as needed for coughing, this is a mixture medicine to help stop coughing and vomiting, be mindful this will make you drowsy  Begin use of prednisone every morning for 5 days, this medicine helps to relax the airway making it easier for you to breathe and to calm your coughing    You can take Tylenol and/or Ibuprofen as needed for fever reduction and pain relief.   For cough: honey 1/2 to 1 teaspoon (you can dilute the honey in water or another fluid).  You can also use guaifenesin and dextromethorphan for cough. You can use a humidifier for chest congestion and cough.  If you don't have a humidifier, you can sit in the bathroom with the hot shower running.      For sore throat: try warm salt water gargles, cepacol lozenges, throat spray, warm tea or water with lemon/honey, popsicles or ice, or OTC cold relief medicine for throat discomfort.   For congestion: take a daily anti-histamine like Zyrtec, Claritin, and a oral decongestant, such as pseudoephedrine.  You can also use Flonase 1-2 sprays in each nostril daily.   It is important to stay hydrated: drink plenty of fluids (water, gatorade/powerade/pedialyte, juices, or teas) to keep your throat moisturized and help further relieve irritation/discomfort.

## 2022-10-29 NOTE — ED Provider Notes (Signed)
[facility]    CSN: [id number] Arrival date & time: 10/29/22  1214      History   Chief Complaint Chief Complaint  Patient presents with   Fever    HPI Jeremiah Bush is a 51 y.o. male.   Patient presents with subjective fever, chills, body aches, nasal congestion, rhinorrhea, sore throat cough and vomiting for 5 days.  Cough is nonproductive, worse when lying down, worsening sore throat and has caused vomiting.  Vomiting has occurred independently of coughing as well.  Decreased appetite but able to tolerate some food and liquids.  Home COVID test 1 day ago negative.  No known sick contacts prior.  Has not attempted treatment.  Former smoker, currently vapes.  Denies shortness of breath or wheezing.    History reviewed. No pertinent past medical history.  There are no problems to display for this patient.   Past Surgical History:  Procedure Laterality Date   ABDOMINAL SURGERY         Home Medications    Prior to Admission medications   Medication Sig Start Date End Date Taking? Authorizing Provider  predniSONE (STERAPRED UNI-PAK 21 TAB) 10 MG (21) TBPK tablet Take 6 tablets on day 1, 5 tablets day 2, 4 tablets day 3, 3 tablets day 4, 2 tablets day 5, 1 tablet day 6 07/11/21   Becky Augusta, NP    Family History No family history on file.  Social History Social History   Tobacco Use   Smoking status: Former    Types: Cigarettes   Smokeless tobacco: Never  Vaping Use   Vaping Use: Every day  Substance Use Topics   Alcohol use: Not Currently   Drug use: Never     Allergies   Patient has no known allergies.   Review of Systems Review of Systems  Constitutional:  Positive for chills and fever. Negative for activity change, appetite change, diaphoresis, fatigue and unexpected weight change.  HENT:  Positive for congestion, rhinorrhea and sore throat. Negative for dental problem, drooling, ear discharge, ear pain, facial swelling, hearing  loss, mouth sores, nosebleeds, postnasal drip, sinus pressure, sinus pain, sneezing, tinnitus, trouble swallowing and voice change.   Respiratory:  Positive for cough. Negative for apnea, choking, chest tightness, shortness of breath, wheezing and stridor.   Cardiovascular: Negative.   Gastrointestinal: Negative.   Musculoskeletal:  Positive for myalgias. Negative for arthralgias, back pain, gait problem, joint swelling, neck pain and neck stiffness.  Skin: Negative.      Physical Exam Triage Vital Signs ED Triage Vitals  Enc Vitals Group     BP 10/29/22 1320 (!) 154/78     Pulse Rate 10/29/22 1320 70     Resp 10/29/22 1320 16     Temp 10/29/22 1320 98.6 F (37 C)     Temp Source 10/29/22 1320 Oral     SpO2 10/29/22 1320 96 %     Weight 10/29/22 1318 179 lb 14.3 oz (81.6 kg)     Height 10/29/22 1318 6\' 1"  (1.854 m)     Head Circumference --      Peak Flow --      Pain Score 10/29/22 1317 4     Pain Loc --      Pain Edu? --      Excl. in GC? --    No data found.  Updated Vital Signs BP (!) 154/78 (BP Location: Right Arm)   Pulse 70   Temp 98.6 F (37 C) (Oral)  Comment: had ibuprofen about 7am this morning.  Resp 16   Ht 6\' 1"  (1.854 m)   Wt 179 lb 14.3 oz (81.6 kg)   SpO2 96%   BMI 23.73 kg/m   Visual Acuity Right Eye Distance:   Left Eye Distance:   Bilateral Distance:    Right Eye Near:   Left Eye Near:    Bilateral Near:     Physical Exam Constitutional:      Appearance: Normal appearance.  HENT:     Right Ear: Tympanic membrane, ear canal and external ear normal.     Left Ear: Tympanic membrane, ear canal and external ear normal.     Nose: Congestion and rhinorrhea present.     Mouth/Throat:     Mouth: Mucous membranes are moist.     Pharynx: Posterior oropharyngeal erythema present.  Eyes:     Extraocular Movements: Extraocular movements intact.  Cardiovascular:     Rate and Rhythm: Normal rate and regular rhythm.     Pulses: Normal pulses.      Heart sounds: Normal heart sounds.  Pulmonary:     Effort: Pulmonary effort is normal.     Breath sounds: Normal breath sounds.  Musculoskeletal:     Cervical back: Normal range of motion and neck supple.  Skin:    General: Skin is warm and dry.  Neurological:     Mental Status: He is alert and oriented to person, place, and time. Mental status is at baseline.  Psychiatric:        Mood and Affect: Mood normal.        Behavior: Behavior normal.      UC Treatments / Results  Labs (all labs ordered are listed, but only abnormal results are displayed) Labs Reviewed - No data to display  EKG   Radiology No results found.  Procedures Procedures (including critical care time)  Medications Ordered in UC Medications - No data to display  Initial Impression / Assessment and Plan / UC Course  I have reviewed the triage vital signs and the nursing notes.  Pertinent labs & imaging results that were available during my care of the patient were reviewed by me and considered in my medical decision making (see chart for details).  Viral URI With cough  Vital signs are stable, lungs are clear to auscultation, O2 saturation 96% on room air, low suspicion for pneumonia or bronchitis at this time, etiology is most likely viral, due to significant erythema to the oropharynx and reporting fever, strep test obtained, pending, if negative watch wait antibiotic placed at pharmacy for day 10 of illness, in the meantime prescribed prednisone, Tessalon and Promethazine DM for supportive care, may use additional over-the-counter medications as needed with follow-up with urgent care as needed Final Clinical Impressions(s) / UC Diagnoses   Final diagnoses:  None   Discharge Instructions   None    ED Prescriptions   None    PDMP not reviewed this encounter.   Hans Eden, NP 10/29/22 [phone]

## 2022-10-29 NOTE — ED Triage Notes (Signed)
Pt c/o cough, fever (101), chills, sweats, vomiting. Started about 5 days ago. He states he did a covid test and was negative. He states he has had the flu before and is usually over it in 2-3 days he feels this is something different. Declines testing.

## 2022-11-11 ENCOUNTER — Ambulatory Visit: Admit: 2022-11-11 | Payer: Self-pay

## 2022-11-13 ENCOUNTER — Ambulatory Visit
Admission: EM | Admit: 2022-11-13 | Discharge: 2022-11-13 | Disposition: A | Discharge status: Home / Self Care | Payer: Self-pay | Attending: Family Medicine | Admitting: Family Medicine

## 2022-11-13 VITALS — BP 127/85 | HR 86 | Temp 98.5°F | Resp 15 | Ht 73.0 in | Wt 179.9 lb

## 2022-11-13 DIAGNOSIS — H6123 Impacted cerumen, bilateral: Secondary | ICD-10-CM

## 2022-11-13 DIAGNOSIS — J40 Bronchitis, not specified as acute or chronic: Secondary | ICD-10-CM

## 2022-11-13 MED ORDER — BENZONATATE 100 MG PO CAPS: 100.0000 mg | ORAL_CAPSULE | Freq: Three times a day (TID) | ORAL | 0 refills | Status: DC

## 2022-11-13 MED ORDER — AZITHROMYCIN 250 MG PO TABS: 250.0000 mg | ORAL_TABLET | Freq: Every day | ORAL | 0 refills | Status: DC

## 2022-11-13 NOTE — ED Provider Notes (Signed)
[facility]    CSN: [id number] Arrival date & time: 11/13/22  2993      History   Chief Complaint Chief Complaint  Patient presents with   Cough    Appointment    HPI Jeremiah Bush is a 51 y.o. male.   HPI   Jeremiah Bush presents for ongoing productive cough. Still feels weak. Chills have resolved.  He did not take the prednisone prescribed by his provider at the urgent care on 2 weeks ago.  States that he does not do well with steroids.  Notes that he missed a whole extra week of work and would like a work note to cover the days that he missed.  No new fever, chills, body aches, shortness of breath, chest tightness, rhinorrhea, vomiting or diarrhea. He is concerned he may have RSV after watching the news.      History reviewed. No pertinent past medical history.  There are no problems to display for this patient.   Past Surgical History:  Procedure Laterality Date   ABDOMINAL SURGERY         Home Medications    Prior to Admission medications   Medication Sig Start Date End Date Taking? Authorizing Provider  azithromycin (ZITHROMAX Z-PAK) 250 MG tablet Take 1 tablet (250 mg total) by mouth daily. 11/13/22  Yes Marrietta Thunder, DO  benzonatate (TESSALON) 100 MG capsule Take 1 capsule (100 mg total) by mouth every 8 (eight) hours. 11/13/22   Fahima Cifelli, Seward Meth, DO  predniSONE (DELTASONE) 20 MG tablet Take 2 tablets (40 mg total) by mouth daily. 10/29/22   White, Elita Boone, NP  promethazine-dextromethorphan (PROMETHAZINE-DM) 6.25-15 MG/5ML syrup Take 5 mLs by mouth 4 (four) times daily as needed for cough. 10/29/22   Valinda Hoar, NP    Family History History reviewed. No pertinent family history.  Social History Social History   Tobacco Use   Smoking status: Former    Types: Cigarettes   Smokeless tobacco: Never  Vaping Use   Vaping Use: Every day  Substance Use Topics   Alcohol use: Not Currently   Drug use: Never     Allergies    Patient has no known allergies.   Review of Systems Review of Systems: negative unless otherwise stated in HPI.      Physical Exam Triage Vital Signs ED Triage Vitals  Enc Vitals Group     BP 11/13/22 0951 127/85     Pulse Rate 11/13/22 0951 86     Resp 11/13/22 0951 15     Temp 11/13/22 0951 98.5 F (36.9 C)     Temp Source 11/13/22 0951 Oral     SpO2 11/13/22 0951 95 %     Weight 11/13/22 0949 179 lb 14.3 oz (81.6 kg)     Height 11/13/22 0949 6\' 1"  (1.854 m)     Head Circumference --      Peak Flow --      Pain Score 11/13/22 0949 3     Pain Loc --      Pain Edu? --      Excl. in GC? --    No data found.  Updated Vital Signs BP 127/85 (BP Location: Right Arm)   Pulse 86   Temp 98.5 F (36.9 C) (Oral)   Resp 15   Ht 6\' 1"  (1.854 m)   Wt 81.6 kg   SpO2 95%   BMI 23.73 kg/m   Visual Acuity Right Eye Distance:   Left Eye Distance:  Bilateral Distance:    Right Eye Near:   Left Eye Near:    Bilateral Near:     Physical Exam GEN:     alert, non-toxic appearing male in no distress    HENT:  mucus membranes moist, clear nasal discharge, bilateral cerumen impaction  EYES:   pupils equal and reactive, no scleral injection or discharge NECK:  normal ROM, no lymphadenopathy, no meningismus   RESP:  no increased work of breathing, rhonchi, no wheezing, no rales  CVS:   regular rate and rhythm Skin:   warm and dry    UC Treatments / Results  Labs (all labs ordered are listed, but only abnormal results are displayed) Labs Reviewed - No data to display  EKG   Radiology No results found.  Procedures Procedures (including critical care time)  Medications Ordered in UC Medications - No data to display  Initial Impression / Assessment and Plan / UC Course  I have reviewed the triage vital signs and the nursing notes.  Pertinent labs & imaging results that were available during my care of the patient were reviewed by me and considered in my medical  decision making (see chart for details).      Pt is a 51 y.o. male who presents for 2 weeks of cought that is not improving.  [name] is  afebrile here without recent antipyretics. Satting adequately on room air. Overall pt is  non-toxic appearing, well hydrated, without respiratory distress. Pulmonary exam is remarkable for rhonchi that clear with cough.  After shared decision making, we will not pursue chest x-ray at this time as it currently would not change management.  COVID  and influenza testing deferred due to length of symptoms. Explained RSV testing is not indicated.  Treat acute bronchitis with antibiotics as below.  Tessalon perles for cough.   Typical duration of symptoms discussed. Discussed unable to give back-dated note as patient was not seen since 10/29/22.    He has evidence of bilateral cerumen impaction. Reports otalgia.  Offered ear irrigation however he states that the last time he had that he had a lot of pain from irrigation.  Explained unable to assess his ear with the cerumen and blocking his ear canal and he does not wish to proceed with irrigation.  Return and ED precautions given and patient voiced understanding. Work note provided.Discussed MDM, treatment plan and plan for follow-up with patient who agrees with plan.      Final Clinical Impressions(s) / UC Diagnoses   Final diagnoses:  Bronchitis  Impacted cerumen, bilateral     Discharge Instructions      Stop by the pharmacy to pick up your prescriptions.  Consider taking the steroids previously prescribed.  Follow up with your primary care provider as needed.      ED Prescriptions     Medication Sig Dispense Auth. Provider   benzonatate (TESSALON) 100 MG capsule Take 1 capsule (100 mg total) by mouth every 8 (eight) hours. 21 capsule [name], DO   azithromycin (ZITHROMAX Z-PAK) 250 MG tablet Take 1 tablet (250 mg total) by mouth daily. 6 tablet Katha Cabal, DO      PDMP not  reviewed this encounter.   Katha Cabal, DO 11/13/22 1120

## 2022-11-13 NOTE — Discharge Instructions (Addendum)
Stop by the pharmacy to pick up your prescriptions.  Consider taking the steroids previously prescribed.  Follow up with your primary care provider as needed.

## 2022-11-13 NOTE — ED Triage Notes (Signed)
Patient c/o cough and chest congestion for over a week.  Patient reports left ear pain that started 2-3 days ago.  Patient denies fevers.

## 2022-12-17 ENCOUNTER — Ambulatory Visit
Admission: RE | Admit: 2022-12-17 | Discharge: 2022-12-17 | Disposition: A | Discharge status: Home / Self Care | Payer: Self-pay | Source: Ambulatory Visit | Attending: Emergency Medicine | Admitting: Emergency Medicine

## 2022-12-17 VITALS — BP 126/82 | HR 78 | Temp 98.3°F | Resp 16

## 2022-12-17 DIAGNOSIS — H6123 Impacted cerumen, bilateral: Secondary | ICD-10-CM

## 2022-12-17 MED ORDER — NEOMYCIN-POLYMYXIN-HC 3.5-10000-1 OT SUSP: 4.0000 [drp] | Freq: Three times a day (TID) | OTIC | 0 refills | Status: DC

## 2022-12-17 NOTE — Discharge Instructions (Addendum)
Your eardrums do not look infected but your canals are red and swollen.  This is most likely from the impacted earwax.  Use the Cortisporin otic to prevent infection and aid in pain relief 3 times a day for the next 5 days.  You will instill 4 drops in each ear with each dose.  You can also take over-the-counter Tylenol and/or ibuprofen according to package instructions as needed for pain or discomfort.

## 2022-12-17 NOTE — ED Provider Notes (Signed)
[facility]    CSN: [id number] Arrival date & time: 12/17/22  1151      History   Chief Complaint Chief Complaint  Patient presents with   Otalgia    HPI Jeremiah Bush is a 52 y.o. male.   HPI  52 year old male here for evaluation of right ear pain.  The patient reports that he has been experiencing pain and decreased hearing in his right ear for the past 5 days.  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  He has tried using the Valsalva maneuver to clear his ears and it has not been helpful.  He denies any fever or drainage.  He has no runny nose or nasal congestion.  He has not seen ENT recently but he has in the past.  History reviewed. No pertinent past medical history.  There are no problems to display for this patient.   Past Surgical History:  Procedure Laterality Date   ABDOMINAL SURGERY         Home Medications    Prior to Admission medications   Medication Sig Start Date End Date Taking? Authorizing Provider  neomycin-polymyxin-hydrocortisone (CORTISPORIN) 3.5-10000-1 OTIC suspension Place 4 drops into both ears 3 (three) times daily. 12/17/22  Yes Margarette Canada, NP    Family History History reviewed. No pertinent family history.  Social History Social History   Tobacco Use   Smoking status: Former    Types: Cigarettes   Smokeless tobacco: Never  Vaping Use   Vaping Use: Every day  Substance Use Topics   Alcohol use: Not Currently   Drug use: Never     Allergies   Patient has no known allergies.   Review of Systems Review of Systems  Constitutional:  Negative for fever.  HENT:  Positive for ear pain and hearing loss. Negative for congestion, ear discharge and rhinorrhea.      Physical Exam Triage Vital Signs ED Triage Vitals  Enc Vitals Group     BP 12/17/22 1216 126/82     Pulse Rate 12/17/22 1216 78     Resp 12/17/22 1216 16     Temp 12/17/22 1216 98.3 F  (36.8 C)     Temp Source 12/17/22 1216 Oral     SpO2 12/17/22 1216 98 %     Weight --      Height --      Head Circumference --      Peak Flow --      Pain Score 12/17/22 1215 4     Pain Loc --      Pain Edu? --      Excl. in [place]? --    No data found.  Updated Vital Signs BP 126/82 (BP Location: Left Arm)   Pulse 78   Temp 98.3 F (36.8 C) (Oral)   Resp 16   SpO2 98%   Visual Acuity Right Eye Distance:   Left Eye Distance:   Bilateral Distance:    Right Eye Near:   Left Eye Near:    Bilateral Near:     Physical Exam Vitals and nursing note reviewed.  Constitutional:      Appearance: Normal appearance. He is not ill-appearing.  HENT:     Head: Normocephalic and atraumatic.     Right Ear: There is impacted cerumen.     Left Ear: There is impacted cerumen.     Ears:     Comments: Patient has hard, dried,  dark cerumen in both external auditory canals completely occluding visualization of the tympanic membrane. Skin:    General: Skin is warm and dry.     Capillary Refill: Capillary refill takes less than 2 seconds.  Neurological:     General: No focal deficit present.     Mental Status: He is alert and oriented to person, place, and time.  Psychiatric:        Mood and Affect: Mood normal.        Behavior: Behavior normal.        Thought Content: Thought content normal.        Judgment: Judgment normal.      UC Treatments / Results  Labs (all labs ordered are listed, but only abnormal results are displayed) Labs Reviewed - No data to display  EKG   Radiology No results found.  Procedures Procedures (including critical care time)  Medications Ordered in UC Medications - No data to display  Initial Impression / Assessment and Plan / UC Course  I have reviewed the triage vital signs and the nursing notes.  Pertinent labs & imaging results that were available during my care of the patient were reviewed by me and considered in my medical decision  making (see chart for details).   Patient is a pleasant, nontoxic-appearing 52 year old male who is here for evaluation of 5 days worth of decreased hearing in pain in his right ear.  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  On exam he has impacted cerumen in both external auditory canals.  He does have some mild pain with palpation of the eustachian tube on the right but not on the left.  I will order a bilateral ear lavage and then reassess for the presence of otitis media.  Ear lavage patient reports that his symptoms have resolved and he is having improved hearing.  Reassessment of both ears reveals erythema to both external auditory canals but the tympanic membranes are pearly gray bilaterally with normal light reflex.  I will cover the patient prophylactically with Cortisporin otic, 4 drops 3 times a day x 5 days.   Final Clinical Impressions(s) / UC Diagnoses   Final diagnoses:  Bilateral impacted cerumen     Discharge Instructions      Your eardrums do not look infected but your canals are red and swollen.  This is most likely from the impacted earwax.  Use the Cortisporin otic to prevent infection and aid in pain relief 3 times a day for the next 5 days.  You will instill 4 drops in each ear with each dose.  You can also take over-the-counter Tylenol and/or ibuprofen according to package instructions as needed for pain or discomfort.     ED Prescriptions     Medication Sig Dispense Auth. Provider   neomycin-polymyxin-hydrocortisone (CORTISPORIN) 3.5-10000-1 OTIC suspension Place 4 drops into both ears 3 (three) times daily. 10 mL Margarette Canada, NP      PDMP not reviewed this encounter.   Margarette Canada, NP 12/17/22 1258

## 2022-12-17 NOTE — ED Triage Notes (Signed)
Pt presents with right ear pain x 5 days.

## 2023-07-09 ENCOUNTER — Ambulatory Visit
Admission: EM | Admit: 2023-07-09 | Discharge: 2023-07-09 | Disposition: A | Discharge status: Home / Self Care | Payer: 59 | Attending: Emergency Medicine | Admitting: Emergency Medicine

## 2023-07-09 DIAGNOSIS — J069 Acute upper respiratory infection, unspecified: Secondary | ICD-10-CM | POA: Insufficient documentation

## 2023-07-09 LAB — GROUP A STREP BY PCR: Group A Strep by PCR: NOT DETECTED

## 2023-07-09 MED ORDER — BENZONATATE 100 MG PO CAPS: 200.0000 mg | ORAL_CAPSULE | Freq: Three times a day (TID) | ORAL | 0 refills | Status: DC

## 2023-07-09 MED ORDER — PROMETHAZINE-DM 6.25-15 MG/5ML PO SYRP: 5.0000 mL | ORAL_SOLUTION | Freq: Four times a day (QID) | ORAL | 0 refills | Status: DC | PRN

## 2023-07-09 MED ORDER — IPRATROPIUM BROMIDE 0.06 % NA SOLN: 2.0000 | Freq: Four times a day (QID) | NASAL | 12 refills | Status: DC

## 2023-07-09 NOTE — ED Triage Notes (Addendum)
Pt c/o sore throat, body chills, fatigue x2days  Pt declines a covid test and asks for a strep test.  Pt asks for a doctors note.

## 2023-07-09 NOTE — Discharge Instructions (Addendum)
Your strep test today was negative.  Your exam is consistent with a viral upper respiratory infection and.  Please use the following medications to help your symptoms.  Use over-the-counter Tylenol and/or ibuprofen according to the package instructions as needed for any fever or pain you may have.  Use the Atrovent nasal spray, 2 squirts in each nostril every 6 hours, as needed for runny nose and postnasal drip.  Use the Tessalon Perles every 8 hours during the day.  Take them with a small sip of water.  They may give you some numbness to the base of your tongue or a metallic taste in your mouth, this is normal.  Use the Promethazine DM cough syrup at bedtime for cough and congestion.  It will make you drowsy so do not take it during the day.  Return for reevaluation or see your primary care provider for any new or worsening symptoms.

## 2023-07-09 NOTE — ED Provider Notes (Signed)
[facility]    CSN: [id number] Arrival date & time: 07/09/23  0909      History   Chief Complaint Chief Complaint  Patient presents with   Sore Throat    HPI AUDON MOHNEY is a 52 y.o. male.   HPI  52 year old male with past medical history significant for abdominal surgery presents for evaluation of cold symptoms which began 2 days ago.  They consist of a subjective fever with sweats and chills, runny nose and nasal congestion, cough that is intermittently productive for yellow sputum, and shortness of breath and wheezing yesterday.  He denies it today.  No pain.  He has not been vaccinated against COVID.  He is declining a COVID test at this time because he states he has been around people who have been vaccinated with multiple episodes of COVID and he has never gotten it.  History reviewed. No pertinent past medical history.  There are no problems to display for this patient.   Past Surgical History:  Procedure Laterality Date   ABDOMINAL SURGERY         Home Medications    Prior to Admission medications   Medication Sig Start Date End Date Taking? Authorizing Provider  benzonatate (TESSALON) 100 MG capsule Take 2 capsules (200 mg total) by mouth every 8 (eight) hours. 07/09/23  Yes Becky Augusta, NP  ipratropium (ATROVENT) 0.06 % nasal spray Place 2 sprays into both nostrils 4 (four) times daily. 07/09/23  Yes Becky Augusta, NP  promethazine-dextromethorphan (PROMETHAZINE-DM) 6.25-15 MG/5ML syrup Take 5 mLs by mouth 4 (four) times daily as needed. 07/09/23  Yes Becky Augusta, NP  neomycin-polymyxin-hydrocortisone (CORTISPORIN) 3.5-10000-1 OTIC suspension Place 4 drops into both ears 3 (three) times daily. 12/17/22   Becky Augusta, NP    Family History History reviewed. No pertinent family history.  Social History Social History   Tobacco Use   Smoking status: Former    Types: Cigarettes   Smokeless tobacco: Never  Vaping Use   Vaping status: Every  Day  Substance Use Topics   Alcohol use: Not Currently   Drug use: Never     Allergies   Patient has no known allergies.   Review of Systems Review of Systems  Constitutional:  Positive for chills and diaphoresis. Negative for fever.  HENT:  Positive for congestion, rhinorrhea and sore throat. Negative for ear pain.   Respiratory:  Positive for cough, shortness of breath and wheezing.   Musculoskeletal:  Positive for arthralgias and myalgias.     Physical Exam Triage Vital Signs ED Triage Vitals [07/09/23 0928]  Encounter Vitals Group     BP      Systolic BP Percentile      Diastolic BP Percentile      Pulse      Resp      Temp      Temp src      SpO2      Weight 200 lb (90.7 kg)     Height 6\' 1"  (1.854 m)     Head Circumference      Peak Flow      Pain Score 0     Pain Loc      Pain Education      Exclude from Growth Chart    No data found.  Updated Vital Signs BP 106/86 (BP Location: Left Arm)   Pulse 60   Temp 98.4 F (36.9 C) (Oral)   Ht 6\' 1"  (1.854 m)   Wt 200  lb (90.7 kg)   SpO2 97%   BMI 26.39 kg/m   Visual Acuity Right Eye Distance:   Left Eye Distance:   Bilateral Distance:    Right Eye Near:   Left Eye Near:    Bilateral Near:     Physical Exam Vitals and nursing note reviewed.  Constitutional:      Appearance: Normal appearance. He is not ill-appearing.  HENT:     Head: Normocephalic and atraumatic.     Right Ear: Tympanic membrane, ear canal and external ear normal. There is no impacted cerumen.     Left Ear: Tympanic membrane, ear canal and external ear normal. There is no impacted cerumen.     Nose: Congestion and rhinorrhea present.     Comments: Nasal mucosa is mildly edematous with scant clear discharge in both nares.    Mouth/Throat:     Mouth: Mucous membranes are moist.     Pharynx: Oropharynx is clear. Posterior oropharyngeal erythema present. No oropharyngeal exudate.     Comments: Tonsillar pillars are 2+ edematous  and erythematous but free of exudate.  Erythema and injection to the posterior oropharynx with clear postnasal drip is present. Cardiovascular:     Rate and Rhythm: Normal rate and regular rhythm.     Pulses: Normal pulses.     Heart sounds: Normal heart sounds. No murmur heard.    No friction rub. No gallop.  Pulmonary:     Effort: Pulmonary effort is normal.     Breath sounds: Normal breath sounds. No wheezing, rhonchi or rales.  Musculoskeletal:     Cervical back: Normal range of motion and neck supple.  Lymphadenopathy:     Cervical: No cervical adenopathy.  Skin:    General: Skin is warm.     Capillary Refill: Capillary refill takes less than 2 seconds.     Findings: No rash.  Neurological:     General: No focal deficit present.     Mental Status: He is alert and oriented to person, place, and time.      UC Treatments / Results  Labs (all labs ordered are listed, but only abnormal results are displayed) Labs Reviewed  GROUP A STREP BY PCR    EKG   Radiology No results found.  Procedures Procedures (including critical care time)  Medications Ordered in UC Medications - No data to display  Initial Impression / Assessment and Plan / UC Course  I have reviewed the triage vital signs and the nursing notes.  Pertinent labs & imaging results that were available during my care of the patient were reviewed by me and considered in my medical decision making (see chart for details).   Patient is a nontoxic-appearing 52 year old male presenting for evaluation of cold symptoms as outlined HPI above.  He is not in any respiratory distress and he is able to speak in full sentences without dyspnea or tachypnea.  Room air oxygen saturation is 97%.  He does have both upper and lower respiratory symptoms as well as inflammation of his nasal mucosa with clear rhinorrhea and clear postnasal drip.  Tonsillar hypertrophy is also present on exam.  I will order a strep PCR to evaluate for  strep.  He is declining COVID testing at this time.  He states that he has never been vaccinated but he has been around people who have been vaccinated who have caught COVID multiple times and he is never caught it.  He does not think it is necessary at this juncture.  Patient is requesting a work note.  Strep PCR is negative.  I will discharge patient on the diagnosis of viral URI with cough and treated with Atrovent nasal rate, Tessalon Perles, Promethazine DM cough syrup.  Over-the-counter Tylenol and/or ibuprofen as needed for pain or fever.  Work note provided.  Return precautions reviewed.   Final Clinical Impressions(s) / UC Diagnoses   Final diagnoses:  Viral URI with cough     Discharge Instructions      Your strep test today was negative.  Your exam is consistent with a viral upper respiratory infection and.  Please use the following medications to help your symptoms.  Use over-the-counter Tylenol and/or ibuprofen according to the package instructions as needed for any fever or pain you may have.  Use the Atrovent nasal spray, 2 squirts in each nostril every 6 hours, as needed for runny nose and postnasal drip.  Use the Tessalon Perles every 8 hours during the day.  Take them with a small sip of water.  They may give you some numbness to the base of your tongue or a metallic taste in your mouth, this is normal.  Use the Promethazine DM cough syrup at bedtime for cough and congestion.  It will make you drowsy so do not take it during the day.  Return for reevaluation or see your primary care provider for any new or worsening symptoms.      ED Prescriptions     Medication Sig Dispense Auth. Provider   benzonatate (TESSALON) 100 MG capsule Take 2 capsules (200 mg total) by mouth every 8 (eight) hours. 21 capsule Becky Augusta, NP   ipratropium (ATROVENT) 0.06 % nasal spray Place 2 sprays into both nostrils 4 (four) times daily. 15 mL Becky Augusta, NP    promethazine-dextromethorphan (PROMETHAZINE-DM) 6.25-15 MG/5ML syrup Take 5 mLs by mouth 4 (four) times daily as needed. 118 mL Becky Augusta, NP      PDMP not reviewed this encounter.   Becky Augusta, NP 07/09/23 1019

## 2023-11-05 ENCOUNTER — Encounter: Payer: Self-pay | Admitting: Emergency Medicine

## 2023-11-05 ENCOUNTER — Ambulatory Visit (INDEPENDENT_AMBULATORY_CARE_PROVIDER_SITE_OTHER): Payer: 59

## 2023-11-05 ENCOUNTER — Ambulatory Visit
Admission: EM | Admit: 2023-11-05 | Discharge: 2023-11-05 | Disposition: A | Discharge status: Home / Self Care | Payer: 59 | Attending: Emergency Medicine | Admitting: Emergency Medicine

## 2023-11-05 DIAGNOSIS — R051 Acute cough: Secondary | ICD-10-CM

## 2023-11-05 DIAGNOSIS — J209 Acute bronchitis, unspecified: Secondary | ICD-10-CM

## 2023-11-05 DIAGNOSIS — J0100 Acute maxillary sinusitis, unspecified: Secondary | ICD-10-CM

## 2023-11-05 MED ORDER — AEROCHAMBER MV MISC: 1 refills | Status: DC

## 2023-11-05 MED ORDER — PREDNISONE 50 MG PO TABS: 50.0000 mg | ORAL_TABLET | Freq: Every day | ORAL | 0 refills | Status: DC

## 2023-11-05 MED ORDER — IPRATROPIUM-ALBUTEROL 0.5-2.5 (3) MG/3ML IN SOLN
3.0000 mL | Freq: Once | RESPIRATORY_TRACT | Status: AC
  Administered 2023-11-05: 3 mL via RESPIRATORY_TRACT

## 2023-11-05 MED ORDER — AMOXICILLIN-POT CLAVULANATE 875-125 MG PO TABS: 1.0000 | ORAL_TABLET | Freq: Two times a day (BID) | ORAL | 0 refills | Status: DC

## 2023-11-05 MED ORDER — ALBUTEROL SULFATE HFA 108 (90 BASE) MCG/ACT IN AERS: 2.0000 | INHALATION_SPRAY | RESPIRATORY_TRACT | 0 refills | Status: DC | PRN

## 2023-11-05 MED ORDER — PREDNISONE 50 MG PO TABS
60.0000 mg | ORAL_TABLET | Freq: Once | ORAL | Status: AC
  Administered 2023-11-05: 60 mg via ORAL

## 2023-11-05 MED ORDER — FLUTICASONE PROPIONATE 50 MCG/ACT NA SUSP: 2.0000 | Freq: Every day | NASAL | 0 refills | Status: DC

## 2023-11-05 MED ORDER — ALBUTEROL SULFATE (2.5 MG/3ML) 0.083% IN NEBU
2.5000 mg | INHALATION_SOLUTION | Freq: Once | RESPIRATORY_TRACT | Status: AC
  Administered 2023-11-05: 2.5 mg via RESPIRATORY_TRACT

## 2023-11-05 NOTE — ED Triage Notes (Signed)
Patient c/o cough and chest congestion for 2 weeks.  Patient denies fevers.  

## 2023-11-05 NOTE — ED Provider Notes (Signed)
HPI  SUBJECTIVE:  Jeremiah Bush is a 52 y.o. male who presents with 2 weeks of nasal congestion, yellow rhinorrhea, sinus pain and pressure, cough productive of yellow sputum, shortness of breath.reports wheezing starting this morning.  He reports decreased expiratory capacity.  He states that this is consistent with previous bronchitis episodes that he usually gets in the wintertime.  No facial swelling, upper dental pain, dyspnea on exertion, chest pain, fevers, double sickening.  No antibiotics in the past 3 months.  No antipyretic in the past 6 hours.  He is able to sleep at night without waking up coughing.  He has tried cough lozenges, drops, vitamin C, increasing water without improvement in his symptoms.  Symptoms are worse with exposure to dust and other pulmonary irritants.  He has a past medical history of bronchitis and had a prescription of albuterol, but lost his inhaler.  He occasionally vapes.  No history of tobacco use, pulmonary disease, illicit drug use.  PCP: None.   History reviewed. No pertinent past medical history.  Past Surgical History:  Procedure Laterality Date   ABDOMINAL SURGERY      History reviewed. No pertinent family history.  Social History   Tobacco Use   Smoking status: Former    Types: Cigarettes   Smokeless tobacco: Never  Vaping Use   Vaping status: Every Day  Substance Use Topics   Alcohol use: Not Currently   Drug use: Never    No current facility-administered medications for this encounter.  Current Outpatient Medications:    albuterol (VENTOLIN HFA) 108 (90 Base) MCG/ACT inhaler, Inhale 2 puffs into the lungs every 4 (four) hours as needed for wheezing or shortness of breath., Disp: 1 each, Rfl: 0   amoxicillin-clavulanate (AUGMENTIN) 875-125 MG tablet, Take 1 tablet by mouth every 12 (twelve) hours., Disp: 14 tablet, Rfl: 0   fluticasone (FLONASE) 50 MCG/ACT nasal spray, Place 2 sprays into both nostrils daily., Disp: 16 g, Rfl: 0    predniSONE (DELTASONE) 50 MG tablet, Take 1 tablet (50 mg total) by mouth daily with breakfast., Disp: 5 tablet, Rfl: 0   Spacer/Aero-Holding Chambers (AEROCHAMBER MV) inhaler, Use as instructed, Disp: 1 each, Rfl: 1  No Known Allergies   ROS  As noted in HPI.   Physical Exam  BP 114/74 (BP Location: Left Arm)   Pulse 66   Temp 98.3 F (36.8 C) (Oral)   Resp 16   Ht 6\' 1"  (1.854 m)   Wt 90.7 kg   SpO2 98%   BMI 26.38 kg/m   Constitutional: Well developed, well nourished, no acute distress.  Coughing. Eyes:  EOMI, conjunctiva normal bilaterally HENT: Normocephalic, atraumatic,mucus membranes moist.  Positive purulent nasal congestion.  Positive right maxillary sinus tenderness.  No other sinus tenderness.  No postnasal drip. Respiratory: Normal inspiratory effort, fair air movement, diffuse wheezing throughout.  Positive lateral chest wall tenderness. Cardiovascular: Normal rate, regular rhythm, no murmurs GI: nondistended skin: No rash, skin intact Musculoskeletal: no deformities Neurologic: Alert & oriented x 3, no focal neuro deficits Psychiatric: Speech and behavior appropriate   ED Course   Medications  albuterol (PROVENTIL) (2.5 MG/3ML) 0.083% nebulizer solution 2.5 mg (2.5 mg Nebulization Given 11/05/23 0945)  ipratropium-albuterol (DUONEB) 0.5-2.5 (3) MG/3ML nebulizer solution 3 mL (3 mLs Nebulization Given 11/05/23 0945)  predniSONE (DELTASONE) tablet 60 mg (60 mg Oral Given 11/05/23 0923)    Orders Placed This Encounter  Procedures   DG Chest 2 View    Standing Status:  Standing    Number of Occurrences:   1    Reason for Exam (SYMPTOM  OR DIAGNOSIS REQUIRED):   coug 2 weeks r/o PNA   CLINICAL DATA:  Cough for the past 2 weeks.   EXAM: CHEST - 2 VIEW   COMPARISON:  None Available.   FINDINGS: The heart size and mediastinal contours are within normal limits. Both lungs are clear. The visualized skeletal structures are unremarkable.    IMPRESSION: No active cardiopulmonary disease.     Electronically Signed   By: Obie Dredge M.D.   On: 11/05/2023 10:11  No results found for this or any previous visit (from the past 24 hours). No results found.  ED Clinical Impression  1. Acute non-recurrent maxillary sinusitis   2. Acute cough   3. Bronchospasm with bronchitis, acute      ED Assessment/Plan     Patient presents with an acute maxillary sinusitis and acute cough, suspect bronchitis versus bronchospasm.  Will get x-ray to rule out pneumonia given duration of symptoms.  Will give DuoNeb 5/0.5 and 60 mg of prednisone.  Will reevaluate.  Reviewed imaging independently.  No obvious infiltrate, consolidation as read by me.  Formal radiology overread pending.  Will contact patient at [phone] if radiology overread differs enough from mine and we need to change management.  Will add on azithromycin if chest x-ray read positive for pneumonia.  Reviewed radiology report.  No acute cardiopulmonary disease consistent with my read.  See radiology report for full details.  Reevaluation, patient states that he feels better.  He spilled some of the DuoNeb, but declined more.  Improved air movement.  Lungs clear bilaterally.  No pneumonia on x-ray.   Home with Augmentin for 7 days for the sinusitis, saline nasal irrigation and Flonase.  Regularly scheduled albuterol inhaler with a spacer for 4 days, then as needed thereafter, prednisone 50 mg for 5 days.  Work note for today and tomorrow as he works at [employer] and is around a lot of pulmonary irritants.  Primary care list.  ER return precautions given.  Discussed  imaging, MDM, treatment plan, and plan for follow-up with patient. Discussed sn/sx that should prompt return to the ED. patient agrees with plan.   Meds ordered this encounter  Medications   albuterol (PROVENTIL) (2.5 MG/3ML) 0.083% nebulizer solution 2.5 mg   ipratropium-albuterol (DUONEB) 0.5-2.5 (3) MG/3ML  nebulizer solution 3 mL   predniSONE (DELTASONE) tablet 60 mg   albuterol (VENTOLIN HFA) 108 (90 Base) MCG/ACT inhaler    Sig: Inhale 2 puffs into the lungs every 4 (four) hours as needed for wheezing or shortness of breath.    Dispense:  1 each    Refill:  0   fluticasone (FLONASE) 50 MCG/ACT nasal spray    Sig: Place 2 sprays into both nostrils daily.    Dispense:  16 g    Refill:  0   predniSONE (DELTASONE) 50 MG tablet    Sig: Take 1 tablet (50 mg total) by mouth daily with breakfast.    Dispense:  5 tablet    Refill:  0   Spacer/Aero-Holding Chambers (AEROCHAMBER MV) inhaler    Sig: Use as instructed    Dispense:  1 each    Refill:  1   amoxicillin-clavulanate (AUGMENTIN) 875-125 MG tablet    Sig: Take 1 tablet by mouth every 12 (twelve) hours.    Dispense:  14 tablet    Refill:  0      *This clinic  note was created using Scientist, clinical (histocompatibility and immunogenetics). Therefore, there may be occasional mistakes despite careful proofreading.  ?    Domenick Gong, MD 11/07/23 1755

## 2023-11-05 NOTE — Discharge Instructions (Signed)
Take two puffs from your albuterol inhaler every 4 hours for 2 days, then every 6 hours for 2 days, then as needed. You can back off if you start to fimprove  sooner. Finish the steroids unless your doctor tells you to stop. Finish the antibiotics, even if you feel better.  You may start the steroids tomorrow, as you have already taken today's dose. You may take tylenol 1 gram up to 4 times a day as needed for pain. This with 600 mg of motrin is an effective combination for pain and fever. Make sure you drink extra fluids. Return if you get worse, have a fever >100.4, or any other concerns.   Did not appreciate any pneumonia on your x-ray.  I will contact you if the radiology overread differs enough from mine and I will add on another antibiotic if it comes back read positive as pneumonia.  Saline nasal irrigation with a NeilMed sinus rinse and distilled water as often as you want, Flonase and Mucinex for the nasal congestion.  If the spacer is too expensive at the pharmacy, you can get an AeroChamber Z-Stat off of [employer] for about $10-$15.  Go to www.goodrx.com  or www.costplusdrugs.com to look up your medications. This will give you a list of where you can find your prescriptions at the most affordable prices. Or ask the pharmacist what the cash price is, or if they have any other discount programs available to help make your medication more affordable. This can be less expensive than what you would pay with insurance.

## 2024-03-14 ENCOUNTER — Ambulatory Visit (INDEPENDENT_AMBULATORY_CARE_PROVIDER_SITE_OTHER): Admitting: Family Medicine

## 2024-03-14 ENCOUNTER — Encounter: Payer: Self-pay | Admitting: Family Medicine

## 2024-03-14 VITALS — BP 120/88 | HR 70 | Ht 73.0 in | Wt 188.0 lb

## 2024-03-14 DIAGNOSIS — Z125 Encounter for screening for malignant neoplasm of prostate: Secondary | ICD-10-CM

## 2024-03-14 DIAGNOSIS — L918 Other hypertrophic disorders of the skin: Secondary | ICD-10-CM

## 2024-03-14 DIAGNOSIS — Z1211 Encounter for screening for malignant neoplasm of colon: Secondary | ICD-10-CM

## 2024-03-14 DIAGNOSIS — R350 Frequency of micturition: Secondary | ICD-10-CM

## 2024-03-14 DIAGNOSIS — Z131 Encounter for screening for diabetes mellitus: Secondary | ICD-10-CM

## 2024-03-14 DIAGNOSIS — Z13220 Encounter for screening for lipoid disorders: Secondary | ICD-10-CM | POA: Diagnosis not present

## 2024-03-14 DIAGNOSIS — Z800 Family history of malignant neoplasm of digestive organs: Secondary | ICD-10-CM

## 2024-03-14 DIAGNOSIS — Z1159 Encounter for screening for other viral diseases: Secondary | ICD-10-CM

## 2024-03-14 DIAGNOSIS — Z136 Encounter for screening for cardiovascular disorders: Secondary | ICD-10-CM

## 2024-03-14 LAB — POCT URINALYSIS DIPSTICK
Bilirubin, UA: NEGATIVE
Blood, UA: NEGATIVE
Glucose, UA: NEGATIVE
Ketones, UA: NEGATIVE
Leukocytes, UA: NEGATIVE
Nitrite, UA: NEGATIVE
Protein, UA: NEGATIVE
Spec Grav, UA: 1.025 (ref 1.010–1.025)
Urobilinogen, UA: 0.2 U/dL
pH, UA: 6 (ref 5.0–8.0)

## 2024-03-14 MED ORDER — TAMSULOSIN HCL 0.4 MG PO CAPS: 0.4000 mg | ORAL_CAPSULE | Freq: Every day | ORAL | 3 refills | Status: DC

## 2024-03-14 NOTE — Progress Notes (Signed)
 New Patient Office Visit  Subjective    Patient ID: Jeremiah Bush, male    DOB: [birth year]  Age: 53 y.o. MRN: [id number]  CC:  Chief Complaint  Patient presents with   Establish Care   Urinary Frequency    Xgetting worse for the past 10 years,Waking up every 2 hours to use the bathroom, no burning    Skin Problem    X 2-3 years, 1 Skin tag, under left arm, tried freeze OTC it didn't help, not getting getting bigger, not painful     HPI Jeremiah Bush presents to establish care.  Concerns:  Frequent urination x 10 years. Worse for the last few months. Denies hematuria, hx of diabetes.  States he was healthy most of his life. Used to get Dive physicals yearly. He is Building control surveyor.  He also has a skin tag over his left under arm area. Denies any pain. He may wan to see Dermatology at some point.   Outpatient Encounter Medications as of 03/14/2024  Medication Sig   tamsulosin (FLOMAX) 0.4 MG CAPS capsule Take 1 capsule (0.4 mg total) by mouth daily.   [DISCONTINUED] fluticasone  (FLONASE ) 50 MCG/ACT nasal spray Place 2 sprays into both nostrils daily.   [DISCONTINUED] albuterol  (VENTOLIN  HFA) 108 (90 Base) MCG/ACT inhaler Inhale 2 puffs into the lungs every 4 (four) hours as needed for wheezing or shortness of breath.   [DISCONTINUED] amoxicillin -clavulanate (AUGMENTIN ) 875-125 MG tablet Take 1 tablet by mouth every 12 (twelve) hours.   [DISCONTINUED] predniSONE  (DELTASONE ) 50 MG tablet Take 1 tablet (50 mg total) by mouth daily with breakfast.   [DISCONTINUED] Spacer/Aero-Holding Chambers (AEROCHAMBER MV) inhaler Use as instructed   No facility-administered encounter medications on file as of 03/14/2024.    Past Medical History:  Diagnosis Date   Diverticulitis     Past Surgical History:  Procedure Laterality Date   ABDOMINAL SURGERY      Family History  Problem Relation Age of Onset   Cancer Sister    Stroke Paternal Grandfather     Social History    Socioeconomic History   Marital status: Single    Spouse name: Not on file   Number of children: 1   Years of education: Not on file   Highest education level: Associate degree: occupational, Scientist, product/process development, or vocational program  Occupational History   Not on file  Tobacco Use   Smoking status: Former    Current packs/day: 0.00    Average packs/day: 0.3 packs/day for 25.0 years (6.3 ttl pk-yrs)    Types: Cigarettes, E-cigarettes    Start date: 27    Quit date: 2019    Years since quitting: 6.3   Smokeless tobacco: Never  Vaping Use   Vaping status: Every Day   Substances: Nicotine  Substance and Sexual Activity   Alcohol use: Not Currently   Drug use: Yes    Types: Marijuana    Comment: take a gummy (THC)   Sexual activity: Not Currently    Birth control/protection: Condom  Other Topics Concern   Not on file  Social History Narrative   Not on file   Social Drivers of Health   Financial Resource Strain: Low Risk  (03/10/2024)   Overall Financial Resource Strain (CARDIA)    Difficulty of Paying Living Expenses: Not very hard  Food Insecurity: No Food Insecurity (03/10/2024)   Hunger Vital Sign    Worried About Running Out of Food in the Last Year: Never true  Ran Out of Food in the Last Year: Never true  Transportation Needs: No Transportation Needs (03/10/2024)   PRAPARE - Administrator, Civil Service (Medical): No    Lack of Transportation (Non-Medical): No  Physical Activity: Sufficiently Active (03/10/2024)   Exercise Vital Sign    Days of Exercise per Week: 3 days    Minutes of Exercise per Session: 80 min  Stress: Stress Concern Present (03/10/2024)   [employer] of Occupational Health - Occupational Stress Questionnaire    Feeling of Stress : To some extent  Social Connections: Socially Isolated (03/10/2024)   Social Connection and Isolation Panel [NHANES]    Frequency of Communication with Friends and Family: More than three times a week     Frequency of Social Gatherings with Friends and Family: Once a week    Attends Religious Services: Never    Database administrator or Organizations: No    Attends Engineer, structural: Not on file    Marital Status: Never married  Intimate Partner Violence: Not At Risk (03/14/2024)   Humiliation, Afraid, Rape, and Kick questionnaire    Fear of Current or Ex-Partner: No    Emotionally Abused: No    Physically Abused: No    Sexually Abused: No    Review of Systems  All other systems reviewed and are negative.       Objective    BP 120/88   Pulse 70   Ht 6\' 1"  (1.854 m)   Wt 188 lb (85.3 kg)   SpO2 97%   BMI 24.80 kg/m   Physical Exam Vitals and nursing note reviewed.  Constitutional:      Appearance: Normal appearance.  HENT:     Head: Normocephalic.     Right Ear: External ear normal.     Left Ear: External ear normal.  Eyes:     Conjunctiva/sclera: Conjunctivae normal.  Cardiovascular:     Rate and Rhythm: Normal rate.  Pulmonary:     Effort: Pulmonary effort is normal. No respiratory distress.  Abdominal:     Palpations: Abdomen is soft.  Musculoskeletal:        General: Normal range of motion.  Skin:    General: Skin is warm.     Comments: Small skin colored nodule noted over medial surface of left arm.  Neurological:     Mental Status: He is alert and oriented to person, place, and time.  Psychiatric:        Mood and Affect: Mood normal.        Assessment & Plan:   Problem List Items Addressed This Visit   None Visit Diagnoses       Urinary frequency    -  Primary   Relevant Medications   tamsulosin (FLOMAX) 0.4 MG CAPS capsule   Other Relevant Orders   POCT Urinalysis Dipstick (Completed)   Comprehensive metabolic panel with GFR   PSA     Encounter for lipid screening for cardiovascular disease       Relevant Orders   CBC with Differential/Platelet   Hemoglobin A1c   Lipid panel     Diabetes mellitus screening          Screening for colon cancer         Screening for prostate cancer         Encounter for hepatitis C screening test for low risk patient       Relevant Orders   Hepatitis C antibody  Skin tag         Family history of colon cancer       Relevant Orders   Ambulatory referral to Gastroenterology     Pt likely has BPH given chronicity, will do Flomax trial. Side effect explained.  Routine labs done.  Colonoscopy referral placed. RTC for Annual.  Return in about 4 weeks (around 04/11/2024) for Annual.   Ladena Picking, MD

## 2024-03-15 ENCOUNTER — Telehealth: Payer: Self-pay

## 2024-03-15 NOTE — Telephone Encounter (Signed)
 Patient has requested to have his colonoscopy performed at [facility] since he lives in [place], however he does not know anyone and doesn't want to ask a coworker for transportation to his colonoscopy.  He has requested to call back to schedule later on this week to see if he can line up transportation.  Thanks, [place], [place]

## 2024-03-22 ENCOUNTER — Encounter: Payer: Self-pay | Admitting: Family Medicine

## 2024-03-22 LAB — CBC WITH DIFFERENTIAL/PLATELET
Basophils Absolute: 0.1 10*3/uL (ref 0.0–0.2)
Basos: 1 %
EOS (ABSOLUTE): 0.5 10*3/uL — ABNORMAL HIGH (ref 0.0–0.4)
Eos: 7 %
Hematocrit: 46.7 % (ref 37.5–51.0)
Hemoglobin: 15 g/dL (ref 13.0–17.7)
Immature Grans (Abs): 0 10*3/uL (ref 0.0–0.1)
Immature Granulocytes: 0 %
Lymphocytes Absolute: 2 10*3/uL (ref 0.7–3.1)
Lymphs: 28 %
MCH: 27.8 pg (ref 26.6–33.0)
MCHC: 32.1 g/dL (ref 31.5–35.7)
MCV: 87 fL (ref 79–97)
Monocytes Absolute: 0.7 10*3/uL (ref 0.1–0.9)
Monocytes: 10 %
Neutrophils Absolute: 3.8 10*3/uL (ref 1.4–7.0)
Neutrophils: 54 %
Platelets: 330 10*3/uL (ref 150–450)
RBC: 5.39 x10E6/uL (ref 4.14–5.80)
RDW: 12.9 % (ref 11.6–15.4)
WBC: 7 10*3/uL (ref 3.4–10.8)

## 2024-03-22 LAB — HEMOGLOBIN A1C
Est. average glucose Bld gHb Est-mCnc: 120 mg/dL
Hgb A1c MFr Bld: 5.8 % — ABNORMAL HIGH (ref 4.8–5.6)

## 2024-03-22 LAB — PSA: Prostate Specific Ag, Serum: 0.8 ng/mL (ref 0.0–4.0)

## 2024-03-22 LAB — COMPREHENSIVE METABOLIC PANEL WITH GFR
ALT: 17 IU/L (ref 0–44)
AST: 20 IU/L (ref 0–40)
Albumin: 4.6 g/dL (ref 3.8–4.9)
Alkaline Phosphatase: 70 IU/L (ref 44–121)
BUN/Creatinine Ratio: 25 — ABNORMAL HIGH (ref 9–20)
BUN: 25 mg/dL — ABNORMAL HIGH (ref 6–24)
Bilirubin Total: 0.4 mg/dL (ref 0.0–1.2)
CO2: 23 mmol/L (ref 20–29)
Calcium: 9.6 mg/dL (ref 8.7–10.2)
Chloride: 101 mmol/L (ref 96–106)
Creatinine, Ser: 1 mg/dL (ref 0.76–1.27)
Globulin, Total: 2.3 g/dL (ref 1.5–4.5)
Glucose: 110 mg/dL — ABNORMAL HIGH (ref 70–99)
Potassium: 5 mmol/L (ref 3.5–5.2)
Sodium: 139 mmol/L (ref 134–144)
Total Protein: 6.9 g/dL (ref 6.0–8.5)
eGFR: 91 mL/min/{1.73_m2} (ref >59)

## 2024-03-22 LAB — LIPID PANEL
Chol/HDL Ratio: 4.3 ratio (ref 0.0–5.0)
Cholesterol, Total: 217 mg/dL — ABNORMAL HIGH (ref 100–199)
HDL: 50 mg/dL (ref >39)
LDL Chol Calc (NIH): 151 mg/dL — ABNORMAL HIGH (ref 0–99)
Triglycerides: 89 mg/dL (ref 0–149)
VLDL Cholesterol Cal: 16 mg/dL (ref 5–40)

## 2024-03-22 LAB — HEPATITIS C ANTIBODY: Hep C Virus Ab: NONREACTIVE

## 2024-04-05 ENCOUNTER — Encounter: Payer: Self-pay | Admitting: *Deleted

## 2024-04-11 ENCOUNTER — Encounter: Payer: Self-pay | Admitting: Family Medicine

## 2024-04-11 ENCOUNTER — Ambulatory Visit (INDEPENDENT_AMBULATORY_CARE_PROVIDER_SITE_OTHER): Admitting: Family Medicine

## 2024-04-11 VITALS — BP 128/60 | HR 84 | Temp 97.8°F | Ht 73.0 in | Wt 189.0 lb

## 2024-04-11 DIAGNOSIS — Z0000 Encounter for general adult medical examination without abnormal findings: Secondary | ICD-10-CM

## 2024-04-11 DIAGNOSIS — E782 Mixed hyperlipidemia: Secondary | ICD-10-CM | POA: Insufficient documentation

## 2024-04-11 DIAGNOSIS — Z23 Encounter for immunization: Secondary | ICD-10-CM | POA: Diagnosis not present

## 2024-04-11 DIAGNOSIS — R7303 Prediabetes: Secondary | ICD-10-CM | POA: Diagnosis not present

## 2024-04-11 DIAGNOSIS — R399 Unspecified symptoms and signs involving the genitourinary system: Secondary | ICD-10-CM

## 2024-04-11 NOTE — Progress Notes (Signed)
 Complete physical exam  Patient: Jeremiah Bush   DOB: 1971-09-20   53 y.o. Male  MRN: [id number]   The 10-year ASCVD risk score (Arnett DK, et al., 2019) is: 4.7%  Subjective:     Chief Complaint  Patient presents with   Annual Exam   Skin Tag    Maybe derm referral       Jeremiah Bush is a 53 y.o. male who presents today for a complete physical exam. He reports consuming a general diet. Gym/ health club routine includes mod to heavy weightlifting. He generally feels poorly. He reports sleeping poorly. He does have additional problems to discuss today.    Most recent fall risk assessment:    03/14/2024    2:59 PM  Fall Risk   Falls in the past year? 1  Number falls in past yr: 1  Injury with Fall? 0  Risk for fall due to : No Fall Risks  Follow up Falls evaluation completed     Most recent depression screenings:    04/11/2024    8:46 AM 03/14/2024    2:59 PM  PHQ 2/9 Scores  PHQ - 2 Score 2 0  PHQ- 9 Score 10     Vision:Within last year    Patient Care Team: Chaitra Mast K, MD as PCP - General (Family Medicine)   Outpatient Medications Prior to Visit  Medication Sig   Creatine POWD by Does not apply route.   feeding supplement (BOOST HIGH PROTEIN) LIQD Take 1 Container by mouth 3 (three) times daily between meals.   tamsulosin  (FLOMAX ) 0.4 MG CAPS capsule Take 1 capsule (0.4 mg total) by mouth daily.   No facility-administered medications prior to visit.    Review of Systems  All other systems reviewed and are negative.         Objective:     BP 128/60   Pulse 84   Temp 97.8 F (36.6 C)   Ht 6\' 1"  (1.854 m)   Wt 189 lb (85.7 kg)   SpO2 99%   BMI 24.94 kg/m  \  Physical Exam Vitals and nursing note reviewed.  Constitutional:      Appearance: Normal appearance.  HENT:     Head: Normocephalic.     Right Ear: External ear normal.     Left Ear: External ear normal.  Eyes:     Conjunctiva/sclera: Conjunctivae normal.   Cardiovascular:     Rate and Rhythm: Normal rate.  Pulmonary:     Effort: Pulmonary effort is normal. No respiratory distress.  Abdominal:     Palpations: Abdomen is soft.  Musculoskeletal:        General: Normal range of motion.  Skin:    General: Skin is warm.  Neurological:     Mental Status: He is alert and oriented to person, place, and time.  Psychiatric:        Mood and Affect: Mood normal.      No results found for any visits on 04/11/24. Last metabolic panel Lab Results  Component Value Date   GLUCOSE 110 (H) 03/21/2024   NA 139 03/21/2024   K 5.0 03/21/2024   CL 101 03/21/2024   CO2 23 03/21/2024   BUN 25 (H) 03/21/2024   CREATININE 1.00 03/21/2024   EGFR 91 03/21/2024   CALCIUM 9.6 03/21/2024   PROT 6.9 03/21/2024   ALBUMIN 4.6 03/21/2024   LABGLOB 2.3 03/21/2024   BILITOT 0.4 03/21/2024   ALKPHOS 70 03/21/2024  AST 20 03/21/2024   ALT 17 03/21/2024        Assessment & Plan:    Routine Health Maintenance and Physical Exam  Immunization History  Administered Date(s) Administered   Tdap 04/11/2024   Zoster Recombinant(Shingrix) 04/11/2024    Health Maintenance  Topic Date Due   COVID-19 Vaccine (1) Never done   HIV Screening  Never done   Colonoscopy  Never done   Zoster Vaccines- Shingrix (2 of 2) 06/06/2024   INFLUENZA VACCINE  06/16/2024   DTaP/Tdap/Td (2 - Td or Tdap) 04/11/2034   Hepatitis C Screening  Completed   HPV VACCINES  Aged Out   Meningococcal B Vaccine  Aged Out    Discussed health benefits of physical activity, and encouraged him to engage in regular exercise appropriate for his age and condition.  Problem List Items Addressed This Visit   None Visit Diagnoses       Mixed hyperlipidemia    -  Primary     Prediabetes         Encounter for immunization       Relevant Orders   Tdap vaccine greater than or equal to 7yo IM (Completed)   Varicella-zoster vaccine IM (Completed)     Lower urinary tract symptoms (LUTS)        Relevant Orders   Ambulatory referral to Urology     Annual physical exam          Nocturia:  Flomax , pt reports no improvement. Normal PSA  and Urine Dip. Urology referral placed. Labs reviewed. Discussed to focus on diet and exercise. Colonoscopy:  Pt needs a ride back home. States he will try to get it done. Vaccination: Tetanus and Shingles vaccine given. He will RTC in 6 months to check labs and get 2nd dose of Shingles. Return in about 6 months (around 10/12/2024), or if symptoms worsen or fail to improve.     Vinary K Kalob Bergen, MD

## 2024-04-11 NOTE — Patient Instructions (Signed)
 Please follow up with Urology and GI for colonoscopy.   Will do Shingles second shot  in 6 months.  Take Tylenol for pain.

## 2024-05-05 ENCOUNTER — Other Ambulatory Visit: Payer: Self-pay | Admitting: Family Medicine

## 2024-05-05 DIAGNOSIS — R350 Frequency of micturition: Secondary | ICD-10-CM

## 2024-05-08 NOTE — Telephone Encounter (Signed)
 Requested medication (s) are due for refill today: na   Requested medication (s) are on the active medication list: yes   Last refill:  03/14/24 #30 3 refills  Future visit scheduled: yes in 5 months   Notes to clinic:  Pharmacy comment: REQUEST FOR 90 DAYS PRESCRIPTION. DX Code Needed.  Do you want to refill ?     Requested Prescriptions  Pending Prescriptions Disp Refills   tamsulosin  (FLOMAX ) 0.4 MG CAPS capsule [Pharmacy Med Name: TAMSULOSIN  HCL 0.4 MG CAPSULE] 90 capsule 2    Sig: TAKE 1 CAPSULE BY MOUTH EVERY DAY     Urology: Alpha-Adrenergic Blocker Passed - 05/08/2024  4:29 PM      Passed - PSA in normal range and within 360 days    Prostate Specific Ag, Serum  Date Value Ref Range Status  03/21/2024 0.8 0.0 - 4.0 ng/mL Final    Comment:    Roche ECLIA methodology. According to the American Urological Association, Serum PSA should decrease and remain at undetectable levels after radical prostatectomy. The AUA defines biochemical recurrence as an initial PSA value 0.2 ng/mL or greater followed by a subsequent confirmatory PSA value 0.2 ng/mL or greater. Values obtained with different assay methods or kits cannot be used interchangeably. Results cannot be interpreted as absolute evidence of the presence or absence of malignant disease.          Passed - Last BP in normal range    BP Readings from Last 1 Encounters:  04/11/24 128/60         Passed - Valid encounter within last 12 months    Recent Outpatient Visits           3 weeks ago Annual physical exam   [facility] at [facility], Vinay K, MD   1 month ago Urinary frequency   [facility] at [facility], Vinay K, MD       Future Appointments             In 2 days Stoioff, Glendia BROCKS, MD [facility] [place]   In 5 months Kotturi, Vinay K, MD [facility] Medicine at [facility], [facility]

## 2024-05-10 ENCOUNTER — Ambulatory Visit (INDEPENDENT_AMBULATORY_CARE_PROVIDER_SITE_OTHER): Admitting: Urology

## 2024-05-10 VITALS — BP 125/78 | HR 97 | Ht 73.0 in | Wt 190.0 lb

## 2024-05-10 DIAGNOSIS — R399 Unspecified symptoms and signs involving the genitourinary system: Secondary | ICD-10-CM

## 2024-05-10 DIAGNOSIS — R351 Nocturia: Secondary | ICD-10-CM | POA: Diagnosis not present

## 2024-05-10 LAB — MICROSCOPIC EXAMINATION: Bacteria, UA: NONE SEEN

## 2024-05-10 LAB — URINALYSIS, COMPLETE
Bilirubin, UA: NEGATIVE
Glucose, UA: NEGATIVE
Ketones, UA: NEGATIVE
Leukocytes,UA: NEGATIVE
Nitrite, UA: NEGATIVE
Protein,UA: NEGATIVE
RBC, UA: NEGATIVE
Specific Gravity, UA: 1.03 (ref 1.005–1.030)
Urobilinogen, Ur: 0.2 mg/dL (ref 0.2–1.0)
pH, UA: 6 (ref 5.0–7.5)

## 2024-05-10 LAB — BLADDER SCAN AMB NON-IMAGING: Scan Result: 9

## 2024-05-10 MED ORDER — GEMTESA 75 MG PO TABS
75.0000 mg | ORAL_TABLET | Freq: Every day | ORAL | Status: AC
Start: 2024-05-10 — End: 2024-06-07

## 2024-05-10 NOTE — Progress Notes (Signed)
 I, Jeremiah Bush, acting as a scribe for Jeremiah JAYSON Barba, MD., have documented all relevant documentation on the behalf of Jeremiah JAYSON Barba, MD, as directed by Jeremiah JAYSON Barba, MD while in the presence of Jeremiah JAYSON Barba, MD.  05/10/2024 10:03 PM   Jeremiah Bush 12-Feb-1971 [id number]  Referring provider: Sol Mackey POUR, MD [address] [place],  [place] [address]  Chief Complaint  Patient presents with   Establish Care     Lower urinary tract symptoms    HPI: Jeremiah Bush is a 53 y.o. male referred for evaluation of lower urinary tract symptoms.   5-6 year history of bothersome lower urinary tract symptoms including sensation of incomplete emptying, urinary frequency/urgency, and nocturia x3. IPSS today 20/35 His most bothersome urinary symptom is nocturia.  He has a history of snoring, but no diagnosis or testing for sleep apnea.  He has limited his nighttime fluids, without improvement in his symptoms He tried tamsulosin  for 1 month and had no changes in his voiding symptoms, and is no longer on the medication. Denies dysuria, gross hematuria.  Denies flank, abdominal, or pelvic pain. No prior urologic evaluation.    PMH: Past Medical History:  Diagnosis Date   Allergy    Pollen, and pet dander   Diverticulitis     Surgical History: Past Surgical History:  Procedure Laterality Date   ABDOMINAL SURGERY     SMALL INTESTINE SURGERY  2/09   Diverticulitis    Home Medications:  Allergies as of 05/10/2024   No Known Allergies      Medication List        Accurate as of May 10, 2024 10:03 PM. If you have any questions, ask your nurse or doctor.          STOP taking these medications    Creatine Powd   feeding supplement Liqd   tamsulosin  0.4 MG Caps capsule Commonly known as: FLOMAX        TAKE these medications    Gemtesa 75 MG Tabs Generic drug: Vibegron Take 1 tablet (75 mg total) by mouth daily for 28 days.         Allergies: No Known Allergies  Family History: Family History  Problem Relation Age of Onset   Cancer Sister    Stroke Paternal Grandfather     Social History:  reports that he quit smoking about 6 years ago. His smoking use included cigarettes and e-cigarettes. He started smoking about 31 years ago. He has a 7.5 pack-year smoking history. He has never used smokeless tobacco. He reports that he does not currently use alcohol. He reports current drug use. Drug: Marijuana.   Physical Exam: BP 125/78   Pulse 97   Ht 6' 1 (1.854 m)   Wt 190 lb (86.2 kg)   BMI 25.07 kg/m   Constitutional:  Alert and oriented, No acute distress. HEENT: [place] AT, moist mucus membranes.  Trachea midline, no masses. Cardiovascular: No clubbing, cyanosis, or edema. Respiratory: Normal respiratory effort, no increased work of breathing. GU: Prostate 35 grams smooth without nodules. Psychiatric: Normal mood and affect.   Urinalysis Dipstick/microscopy negative.   Assessment & Plan:    1. Lower Urinary Tract Symptoms Severe LUTS based on IPSS score of 20/35. PVR is 9 mL, indicating adequate bladder emptying. Most bothersome symptom is nocturia, which can be challenging to manage due to potential etiologies such as sleep apnea. No improvement on Tamsulosin ; trial of Gemtesa 75 mg daily  initiated, samples provided for one month. Patient instructed to call back or send a MyChart message regarding the efficacy of Gemtesa.  2. Nocturia As above.  I have reviewed the above documentation for accuracy and completeness, and I agree with the above.   Jeremiah JAYSON Barba, MD  [facility] [address] [place], [place] [address] [phone]

## 2024-05-11 ENCOUNTER — Encounter: Payer: Self-pay | Admitting: Urology

## 2024-10-16 ENCOUNTER — Encounter: Payer: Self-pay | Admitting: Family Medicine

## 2024-10-16 ENCOUNTER — Ambulatory Visit: Admitting: Family Medicine

## 2024-10-17 ENCOUNTER — Encounter: Payer: Self-pay | Admitting: Family Medicine

## 2024-10-25 ENCOUNTER — Ambulatory Visit
Admission: EM | Admit: 2024-10-25 | Discharge: 2024-10-25 | Disposition: A | Discharge status: Home / Self Care | Payer: Self-pay | Attending: Emergency Medicine | Admitting: Emergency Medicine

## 2024-10-25 DIAGNOSIS — J029 Acute pharyngitis, unspecified: Secondary | ICD-10-CM

## 2024-10-25 DIAGNOSIS — Z720 Tobacco use: Secondary | ICD-10-CM

## 2024-10-25 LAB — POCT RAPID STREP A (OFFICE): Rapid Strep A Screen: NEGATIVE

## 2024-10-25 NOTE — ED Provider Notes (Signed)
 [facility]    CSN: [id number] Arrival date & time: 10/25/24  0808      History   Chief Complaint Chief Complaint  Patient presents with   Sore Throat    HPI Jeremiah Bush is a 53 y.o. male.   53 year old male pt, Jeremiah Bush, presents to urgetn care for evaluation of sore throat, right ear pain, fatigue x 2 days. Pt requesting strep test only. Pt reports I can't get covid, I can be in a room full of people with covid and I don't get it, I thought I had it one time and I actually had strep.   Pt endorses vaping and eating edibles occasionally with friends.  The history is provided by the patient. No language interpreter was used.    Past Medical History:  Diagnosis Date   Allergy    Pollen, and pet dander   Diverticulitis     Patient Active Problem List   Diagnosis Date Noted   Vapes nicotine containing substance 10/25/2024   Viral pharyngitis 10/25/2024   Mixed hyperlipidemia 04/11/2024   Prediabetes 04/11/2024    Past Surgical History:  Procedure Laterality Date   ABDOMINAL SURGERY     SMALL INTESTINE SURGERY  2/09   Diverticulitis       Home Medications    Prior to Admission medications   Not on File    Family History Family History  Problem Relation Age of Onset   Cancer Sister    Stroke Paternal Grandfather     Social History Social History   Tobacco Use   Smoking status: Former    Current packs/day: 0.00    Average packs/day: 0.3 packs/day for 29.3 years (7.5 ttl pk-yrs)    Types: Cigarettes, E-cigarettes    Start date: 1994    Quit date: 2019    Years since quitting: 6.9   Smokeless tobacco: Never  Vaping Use   Vaping status: Every Day   Substances: Nicotine  Substance Use Topics   Alcohol use: Not Currently   Drug use: Yes    Types: Marijuana    Comment: take a gummy (THC)     Allergies   Patient has no known allergies.   Review of Systems Review of Systems  Constitutional:  Positive for  fatigue. Negative for fever.  HENT:  Positive for ear pain, sinus pressure and sore throat.   All other systems reviewed and are negative.    Physical Exam Triage Vital Signs ED Triage Vitals  Encounter Vitals Group     BP 10/25/24 0826 138/78     Girls Systolic BP Percentile --      Girls Diastolic BP Percentile --      Boys Systolic BP Percentile --      Boys Diastolic BP Percentile --      Pulse Rate 10/25/24 0826 69     Resp 10/25/24 0826 18     Temp 10/25/24 0826 (!) 97.5 F (36.4 C)     Temp Source 10/25/24 0826 Oral     SpO2 10/25/24 0826 95 %     Weight 10/25/24 0825 200 lb (90.7 kg)     Height --      Head Circumference --      Peak Flow --      Pain Score 10/25/24 0825 5     Pain Loc --      Pain Education --      Exclude from Growth Chart --    No  data found.  Updated Vital Signs BP 138/78 (BP Location: Right Arm)   Pulse 69   Temp (!) 97.5 F (36.4 C) (Oral)   Resp 18   Wt 200 lb (90.7 kg)   SpO2 95%   BMI 26.39 kg/m   Visual Acuity Right Eye Distance:   Left Eye Distance:   Bilateral Distance:    Right Eye Near:   Left Eye Near:    Bilateral Near:     Physical Exam Vitals and nursing note reviewed.  Constitutional:      General: He is not in acute distress.    Appearance: He is well-developed. He is not ill-appearing or toxic-appearing.  HENT:     Head: Normocephalic.     Right Ear: Tympanic membrane is retracted.     Left Ear: Tympanic membrane is retracted.     Nose: Mucosal edema and congestion present.     Mouth/Throat:     Lips: Pink.     Mouth: Mucous membranes are moist.     Pharynx: Uvula midline. Posterior oropharyngeal erythema present. No oropharyngeal exudate.     Tonsils: No tonsillar exudate or tonsillar abscesses.  Eyes:     General: Lids are normal.     Conjunctiva/sclera: Conjunctivae normal.     Pupils: Pupils are equal, round, and reactive to light.  Cardiovascular:     Rate and Rhythm: Normal rate and regular  rhythm.     Heart sounds: Normal heart sounds.  Pulmonary:     Effort: Pulmonary effort is normal. No respiratory distress.     Breath sounds: Normal breath sounds and air entry. No decreased breath sounds or wheezing.  Abdominal:     General: There is no distension.     Palpations: Abdomen is soft.  Musculoskeletal:        General: Normal range of motion.     Cervical back: Normal range of motion.  Skin:    General: Skin is warm and dry.     Findings: No rash.  Neurological:     General: No focal deficit present.     Mental Status: He is alert and oriented to person, place, and time.     GCS: GCS eye subscore is 4. GCS verbal subscore is 5. GCS motor subscore is 6.     Cranial Nerves: No cranial nerve deficit.     Sensory: No sensory deficit.  Psychiatric:        Speech: Speech normal.        Behavior: Behavior normal. Behavior is cooperative.      UC Treatments / Results  Labs (all labs ordered are listed, but only abnormal results are displayed) Labs Reviewed  POCT RAPID STREP A (OFFICE) - Normal  CULTURE, GROUP A STREP [facility])    EKG   Radiology No results found.  Procedures Procedures (including critical care time)  Medications Ordered in UC Medications - No data to display  Initial Impression / Assessment and Plan / UC Course  I have reviewed the triage vital signs and the nursing notes.  Pertinent labs & imaging results that were available during my care of the patient were reviewed by me and considered in my medical decision making (see chart for details).    Discussed exam findings and plan of care with patient, OTC meds for management of symptoms, strep negative office, cx sent, if cx is positive we will call in amoxicillin , strict go to ER precautions given.   Patient verbalized understanding to this provider.  Ddx: Viral pharyngitis, URI, allergies, strep, vape use Final Clinical Impressions(s) / UC Diagnoses   Final diagnoses:  Vapes nicotine  containing substance  Viral pharyngitis     Discharge Instructions      Most likely you have a viral illness: no antibiotic is indicated at this time, May treat with OTC meds of choice. Make sure to drink plenty of fluids to stay hydrated(gatorade, water, popsicles,jello,etc), avoid caffeine products. Follow up with PCP. Return as needed.    ED Prescriptions   None    PDMP not reviewed this encounter.   Aminta Loose, NP 10/25/24 [phone]

## 2024-10-25 NOTE — Discharge Instructions (Addendum)
Most likely you have a viral illness: no antibiotic is indicated at this time, May treat with OTC meds of choice. Make sure to drink plenty of fluids to stay hydrated(gatorade, water, popsicles,jello,etc), avoid caffeine products. Follow up with PCP. Return as needed.

## 2024-10-25 NOTE — ED Triage Notes (Signed)
 Sx x 2 days  Fatigue Sore throat Right ear pain    Just wants strep

## 2024-10-27 ENCOUNTER — Ambulatory Visit (HOSPITAL_COMMUNITY): Payer: Self-pay

## 2024-10-27 LAB — CULTURE, GROUP A STREP (THRC)

## 2024-10-29 ENCOUNTER — Ambulatory Visit
Admission: EM | Admit: 2024-10-29 | Discharge: 2024-10-29 | Disposition: A | Discharge status: Home / Self Care | Payer: Self-pay | Attending: Emergency Medicine | Admitting: Emergency Medicine

## 2024-10-29 ENCOUNTER — Encounter: Payer: Self-pay | Admitting: Emergency Medicine

## 2024-10-29 DIAGNOSIS — H6991 Unspecified Eustachian tube disorder, right ear: Secondary | ICD-10-CM

## 2024-10-29 MED ORDER — PREDNISONE 10 MG (21) PO TBPK: ORAL_TABLET | ORAL | 0 refills | Status: AC

## 2024-10-29 NOTE — Discharge Instructions (Signed)
 Continue to attempt to clear your right ear using the Valsalva maneuver periodically throughout the day.  Start the prednisone  taper when you pick it up this morning.  Going forward take the medication at breakfast.  This will decrease inflammation in your body and should help return patency to eustachian tube.  You may also use over-the-counter Flonase , 2 squirts up each nostril at bedtime to help decrease airway inflammation and nasal congestion.  If you develop any new or worsening symptoms other return for reevaluation or follow-up with your primary care provider.

## 2024-10-29 NOTE — ED Provider Notes (Signed)
 [facility]    CSN: [id number] Arrival date & time: 10/29/24  0810      History   Chief Complaint Chief Complaint  Patient presents with   Letter for School/Work    HPI SOU NOHR is a 53 y.o. male.   HPI  53 year old male with past medical history significant for diverticulitis, seasonal allergies, mixed hyperlipidemia, prediabetes, and eustachian tube dysfunction presents for evaluation and a request for an extension of his work note.  No fevers but the patient continues to complain of pain in the right ear and he is unable to clear it using Valsalva maneuver.  Past Medical History:  Diagnosis Date   Allergy    Pollen, and pet dander   Diverticulitis     Patient Active Problem List   Diagnosis Date Noted   Vapes nicotine containing substance 10/25/2024   Viral pharyngitis 10/25/2024   Mixed hyperlipidemia 04/11/2024   Prediabetes 04/11/2024    Past Surgical History:  Procedure Laterality Date   ABDOMINAL SURGERY     SMALL INTESTINE SURGERY  2/09   Diverticulitis       Home Medications    Prior to Admission medications  Medication Sig Start Date End Date Taking? Authorizing Provider  predniSONE  (STERAPRED UNI-PAK 21 TAB) 10 MG (21) TBPK tablet Take 6 tablets on day 1, 5 tablets day 2, 4 tablets day 3, 3 tablets day 4, 2 tablets day 5, 1 tablet day 6 10/29/24  Yes Bernardino Ditch, NP    Family History Family History  Problem Relation Age of Onset   Cancer Sister    Stroke Paternal Grandfather     Social History Social History[1]   Allergies   Patient has no known allergies.   Review of Systems Review of Systems  Constitutional:  Positive for fever.  HENT:  Positive for ear pain. Negative for ear discharge and hearing loss.      Physical Exam Triage Vital Signs ED Triage Vitals  Encounter Vitals Group     BP      Girls Systolic BP Percentile      Girls Diastolic BP Percentile      Boys Systolic BP Percentile       Boys Diastolic BP Percentile      Pulse      Resp      Temp      Temp src      SpO2      Weight      Height      Head Circumference      Peak Flow      Pain Score      Pain Loc      Pain Education      Exclude from Growth Chart    No data found.  Updated Vital Signs BP 134/85 (BP Location: Right Arm)   Pulse 74   Temp 98.2 F (36.8 C) (Oral)   Resp 16   Ht 6' 1 (1.854 m)   Wt 199 lb 15.3 oz (90.7 kg)   SpO2 95%   BMI 26.38 kg/m   Visual Acuity Right Eye Distance:   Left Eye Distance:   Bilateral Distance:    Right Eye Near:   Left Eye Near:    Bilateral Near:     Physical Exam Vitals and nursing note reviewed.  Constitutional:      Appearance: Normal appearance. He is not ill-appearing.  HENT:     Head: Normocephalic and atraumatic.     Right  Ear: External ear normal. There is impacted cerumen.     Left Ear: External ear normal. There is impacted cerumen.     Ears:     Comments: Cerumen occluding both external auditory canals. Skin:    General: Skin is warm and dry.     Capillary Refill: Capillary refill takes less than 2 seconds.     Findings: No rash.  Neurological:     General: No focal deficit present.     Mental Status: He is alert and oriented to person, place, and time.      UC Treatments / Results  Labs (all labs ordered are listed, but only abnormal results are displayed) Labs Reviewed - No data to display  EKG   Radiology No results found.  Procedures Procedures (including critical care time)  Medications Ordered in UC Medications - No data to display  Initial Impression / Assessment and Plan / UC Course  I have reviewed the triage vital signs and the nursing notes.  Pertinent labs & imaging results that were available during my care of the patient were reviewed by me and considered in my medical decision making (see chart for details).   Patient is a nontoxic-appearing 53 year old male presenting with request for an  extension of his work note.  He was supposed to return yesterday but he was unable to due to feeling poorly.  He contacted HR who told him that he needed to request an extension of his work note.  He is continuing to experience URI symptoms including pain and fullness in his right ear.  On exam both of his external auditory canals are occluded by cerumen.  I offered to irrigate his canals and he declined.  He reports that he has had to have that done before but it typically has to be done at ENT due to the narrowness and sharp oblique angles of his ear canals.  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  He is unable to clear his right ear using the Valsalva maneuver in the exam room.  I offered him prednisone  and he accepted that stating that in the past, when he was an active diver, they would prescribe it to him periodically because of the difficulty clearing his eustachian tubes.  I will extend his work note for 2 additional days and send him home with a diagnosis of eustachian tube dysfunction on a 6-day taper of prednisone .   Final Clinical Impressions(s) / UC Diagnoses   Final diagnoses:  Dysfunction of right eustachian tube     Discharge Instructions      Continue to attempt to clear your right ear using the Valsalva maneuver periodically throughout the day.  Start the prednisone  taper when you pick it up this morning.  Going forward take the medication at breakfast.  This will decrease inflammation in your body and should help return patency to eustachian tube.  You may also use over-the-counter Flonase , 2 squirts up each nostril at bedtime to help decrease airway inflammation and nasal congestion.  If you develop any new or worsening symptoms other return for reevaluation or follow-up with your primary care provider.     ED Prescriptions     Medication Sig Dispense Auth. Provider   predniSONE   (STERAPRED UNI-PAK 21 TAB) 10 MG (21) TBPK tablet Take 6 tablets on day 1, 5 tablets day 2, 4 tablets day 3, 3 tablets day 4, 2 tablets  day 5, 1 tablet day 6 21 tablet Bernardino Ditch, NP      PDMP not reviewed this encounter.    [1]  Social History Tobacco Use   Smoking status: Former    Current packs/day: 0.00    Average packs/day: 0.3 packs/day for 29.3 years (7.5 ttl pk-yrs)    Types: Cigarettes, E-cigarettes    Start date: 24    Quit date: 2019    Years since quitting: 6.9   Smokeless tobacco: Never  Vaping Use   Vaping status: Every Day   Substances: Nicotine  Substance Use Topics   Alcohol use: Not Currently   Drug use: Yes    Types: Marijuana    Comment: take a gummy (THC)     Bernardino Ditch, NP 10/29/24 [phone]

## 2024-10-29 NOTE — ED Triage Notes (Signed)
 Patient is here for a work note extension due to ongoing viral illness symptoms.
